# Patient Record
Sex: Male | Born: 1980 | Race: Black or African American | Hispanic: No | Marital: Single | Smoking: Never smoker
Health system: Southern US, Academic
[De-identification: ages and names within clinical notes are randomized; demographics above are authoritative.]

## PROBLEM LIST (undated history)

## (undated) DIAGNOSIS — L89159 Pressure ulcer of sacral region, unspecified stage: Secondary | ICD-10-CM

## (undated) DIAGNOSIS — B1920 Unspecified viral hepatitis C without hepatic coma: Secondary | ICD-10-CM

## (undated) DIAGNOSIS — G8220 Paraplegia, unspecified: Secondary | ICD-10-CM

## (undated) DIAGNOSIS — F1990 Other psychoactive substance use, unspecified, uncomplicated: Secondary | ICD-10-CM

## (undated) HISTORY — PX: HX COLOSTOMY: SHX63

## (undated) HISTORY — PX: HIP DEBRIDEMENT: SHX1752

---

## 2015-02-02 DIAGNOSIS — R079 Chest pain, unspecified: Secondary | ICD-10-CM

## 2015-02-02 DIAGNOSIS — R0600 Dyspnea, unspecified: Secondary | ICD-10-CM

## 2015-02-02 DIAGNOSIS — R0602 Shortness of breath: Secondary | ICD-10-CM

## 2015-04-25 DIAGNOSIS — L89229 Pressure ulcer of left hip, unspecified stage: Secondary | ICD-10-CM

## 2015-04-25 DIAGNOSIS — L89219 Pressure ulcer of right hip, unspecified stage: Secondary | ICD-10-CM

## 2015-08-15 ENCOUNTER — Ambulatory Visit (HOSPITAL_COMMUNITY): Payer: Self-pay | Admitting: GENERAL SURGERY

## 2016-08-23 DIAGNOSIS — L89154 Pressure ulcer of sacral region, stage 4: Secondary | ICD-10-CM

## 2016-08-26 DIAGNOSIS — F17210 Nicotine dependence, cigarettes, uncomplicated: Secondary | ICD-10-CM

## 2016-08-26 DIAGNOSIS — F329 Major depressive disorder, single episode, unspecified: Secondary | ICD-10-CM

## 2016-11-29 ENCOUNTER — Inpatient Hospital Stay (HOSPITAL_COMMUNITY): Payer: Medicare Other | Admitting: Radiology

## 2016-11-29 ENCOUNTER — Inpatient Hospital Stay (HOSPITAL_COMMUNITY): Payer: Medicare Other | Admitting: Internal Medicine

## 2016-11-29 ENCOUNTER — Encounter (HOSPITAL_COMMUNITY): Payer: Self-pay | Admitting: Internal Medicine

## 2016-11-29 ENCOUNTER — Inpatient Hospital Stay
Admission: EM | Admit: 2016-11-29 | Discharge: 2016-12-08 | DRG: 871 | Disposition: A | Discharge status: Other Acute Inpatient Hospital | Payer: Medicare Other | Source: Other Acute Inpatient Hospital | Attending: Internal Medicine | Admitting: Internal Medicine

## 2016-11-29 DIAGNOSIS — R0602 Shortness of breath: Secondary | ICD-10-CM | POA: Diagnosis not present

## 2016-11-29 DIAGNOSIS — E43 Unspecified severe protein-calorie malnutrition: Secondary | ICD-10-CM | POA: Diagnosis present

## 2016-11-29 DIAGNOSIS — R253 Fasciculation: Secondary | ICD-10-CM | POA: Diagnosis not present

## 2016-11-29 DIAGNOSIS — R112 Nausea with vomiting, unspecified: Secondary | ICD-10-CM | POA: Diagnosis not present

## 2016-11-29 DIAGNOSIS — G8220 Paraplegia, unspecified: Secondary | ICD-10-CM | POA: Diagnosis present

## 2016-11-29 DIAGNOSIS — M549 Dorsalgia, unspecified: Secondary | ICD-10-CM | POA: Diagnosis not present

## 2016-11-29 DIAGNOSIS — B1920 Unspecified viral hepatitis C without hepatic coma: Secondary | ICD-10-CM | POA: Diagnosis present

## 2016-11-29 DIAGNOSIS — F39 Unspecified mood [affective] disorder: Secondary | ICD-10-CM | POA: Diagnosis present

## 2016-11-29 DIAGNOSIS — Z79899 Other long term (current) drug therapy: Secondary | ICD-10-CM

## 2016-11-29 DIAGNOSIS — F329 Major depressive disorder, single episode, unspecified: Secondary | ICD-10-CM | POA: Diagnosis present

## 2016-11-29 DIAGNOSIS — L89154 Pressure ulcer of sacral region, stage 4: Secondary | ICD-10-CM | POA: Diagnosis present

## 2016-11-29 DIAGNOSIS — Z915 Personal history of self-harm: Secondary | ICD-10-CM

## 2016-11-29 DIAGNOSIS — W3400XS Accidental discharge from unspecified firearms or gun, sequela: Secondary | ICD-10-CM

## 2016-11-29 DIAGNOSIS — Z932 Ileostomy status: Secondary | ICD-10-CM

## 2016-11-29 DIAGNOSIS — R600 Localized edema: Secondary | ICD-10-CM | POA: Diagnosis not present

## 2016-11-29 DIAGNOSIS — Z6827 Body mass index (BMI) 27.0-27.9, adult: Secondary | ICD-10-CM

## 2016-11-29 DIAGNOSIS — L8990 Pressure ulcer of unspecified site, unspecified stage: Secondary | ICD-10-CM | POA: Diagnosis present

## 2016-11-29 DIAGNOSIS — A4102 Sepsis due to Methicillin resistant Staphylococcus aureus: Principal | ICD-10-CM | POA: Diagnosis present

## 2016-11-29 DIAGNOSIS — D62 Acute posthemorrhagic anemia: Secondary | ICD-10-CM | POA: Diagnosis present

## 2016-11-29 DIAGNOSIS — R7881 Bacteremia: Secondary | ICD-10-CM

## 2016-11-29 DIAGNOSIS — Z9081 Acquired absence of spleen: Secondary | ICD-10-CM

## 2016-11-29 DIAGNOSIS — G510 Bell's palsy: Secondary | ICD-10-CM | POA: Diagnosis present

## 2016-11-29 DIAGNOSIS — R768 Other specified abnormal immunological findings in serum: Secondary | ICD-10-CM | POA: Diagnosis present

## 2016-11-29 DIAGNOSIS — I38 Endocarditis, valve unspecified: Secondary | ICD-10-CM

## 2016-11-29 DIAGNOSIS — T148XXS Other injury of unspecified body region, sequela: Secondary | ICD-10-CM

## 2016-11-29 DIAGNOSIS — Z87891 Personal history of nicotine dependence: Secondary | ICD-10-CM

## 2016-11-29 DIAGNOSIS — S73005A Unspecified dislocation of left hip, initial encounter: Secondary | ICD-10-CM | POA: Diagnosis present

## 2016-11-29 DIAGNOSIS — Z9119 Patient's noncompliance with other medical treatment and regimen: Secondary | ICD-10-CM

## 2016-11-29 DIAGNOSIS — M8660 Other chronic osteomyelitis, unspecified site: Secondary | ICD-10-CM | POA: Diagnosis present

## 2016-11-29 DIAGNOSIS — R000 Tachycardia, unspecified: Secondary | ICD-10-CM | POA: Diagnosis present

## 2016-11-29 DIAGNOSIS — Z765 Malingerer [conscious simulation]: Secondary | ICD-10-CM | POA: Diagnosis present

## 2016-11-29 DIAGNOSIS — B9562 Methicillin resistant Staphylococcus aureus infection as the cause of diseases classified elsewhere: Secondary | ICD-10-CM

## 2016-11-29 DIAGNOSIS — A419 Sepsis, unspecified organism: Secondary | ICD-10-CM | POA: Diagnosis present

## 2016-11-29 DIAGNOSIS — Z452 Encounter for adjustment and management of vascular access device: Secondary | ICD-10-CM

## 2016-11-29 HISTORY — DX: Other psychoactive substance use, unspecified, uncomplicated: F19.90

## 2016-11-29 HISTORY — DX: Paraplegia, unspecified (CMS HCC): G82.20

## 2016-11-29 HISTORY — DX: Bacteremia: R78.81

## 2016-11-29 HISTORY — DX: Unspecified viral hepatitis C without hepatic coma: B19.20

## 2016-11-29 HISTORY — DX: Pressure ulcer of sacral region, unspecified stage: L89.159

## 2016-11-29 HISTORY — DX: Methicillin resistant Staphylococcus aureus infection as the cause of diseases classified elsewhere: B95.62

## 2016-11-29 MED ORDER — MORPHINE 4 MG/ML INTRAVENOUS CARTRIDGE
4.0000 mg | CARTRIDGE | INTRAVENOUS | Status: DC | PRN
Start: 2016-11-29 — End: 2016-11-29

## 2016-11-29 MED ORDER — SODIUM CHLORIDE 0.9 % (FLUSH) INJECTION SYRINGE
2.0000 mL | INJECTION | INTRAMUSCULAR | Status: DC | PRN
Start: 2016-11-29 — End: 2016-12-08
  Administered 2016-11-30: 6 mL
  Administered 2016-11-30: 4 mL
  Filled 2016-11-29 (×2): qty 10

## 2016-11-29 MED ORDER — MORPHINE 4 MG/ML INTRAVENOUS CARTRIDGE
4.00 mg | CARTRIDGE | Freq: Four times a day (QID) | INTRAVENOUS | Status: DC | PRN
Start: 2016-11-29 — End: 2016-12-02
  Administered 2016-11-30: 4 mg via INTRAVENOUS
  Filled 2016-11-29: qty 1

## 2016-11-29 MED ORDER — SODIUM CHLORIDE 0.9 % INTRAVENOUS SOLUTION
INTRAVENOUS | Status: DC
Start: 2016-11-30 — End: 2016-12-03
  Administered 2016-11-30 (×2): 75 mL/h via INTRAVENOUS

## 2016-11-29 MED ORDER — PANTOPRAZOLE 40 MG TABLET,DELAYED RELEASE
40.0000 mg | DELAYED_RELEASE_TABLET | Freq: Every day | ORAL | Status: DC
Start: 2016-11-30 — End: 2016-12-08
  Administered 2016-11-30 – 2016-12-08 (×9): 40 mg via ORAL
  Filled 2016-11-29 (×10): qty 1

## 2016-11-29 MED ORDER — FUROSEMIDE 20 MG TABLET
10.00 mg | ORAL_TABLET | Freq: Every evening | ORAL | Status: DC
Start: 2016-11-30 — End: 2016-12-08
  Administered 2016-11-30: 10 mg via ORAL
  Administered 2016-11-30: 0 mg via ORAL
  Administered 2016-12-01 – 2016-12-07 (×7): 10 mg via ORAL
  Filled 2016-11-29 (×2): qty 1
  Filled 2016-11-29: qty 0.5
  Filled 2016-11-29: qty 1
  Filled 2016-11-29: qty 0.5
  Filled 2016-11-29: qty 1
  Filled 2016-11-29 (×3): qty 0.5
  Filled 2016-11-29: qty 1
  Filled 2016-11-29: qty 0.5

## 2016-11-29 MED ORDER — FUROSEMIDE 20 MG TABLET
20.00 mg | ORAL_TABLET | Freq: Every morning | ORAL | Status: DC
Start: 2016-11-30 — End: 2016-12-08
  Administered 2016-11-30 – 2016-12-02 (×3): 20 mg via ORAL
  Administered 2016-12-03: 0 mg via ORAL
  Administered 2016-12-04 – 2016-12-08 (×5): 20 mg via ORAL
  Filled 2016-11-29 (×11): qty 1

## 2016-11-29 MED ORDER — ACETAMINOPHEN 325 MG TABLET
650.0000 mg | ORAL_TABLET | ORAL | Status: DC | PRN
Start: 2016-11-29 — End: 2016-11-30
  Administered 2016-11-30 (×3): 650 mg via ORAL
  Filled 2016-11-29 (×3): qty 2

## 2016-11-29 MED ORDER — ENOXAPARIN 40 MG/0.4 ML SUBCUTANEOUS SYRINGE
40.0000 mg | INJECTION | SUBCUTANEOUS | Status: DC
Start: 2016-11-30 — End: 2016-12-02
  Administered 2016-11-30 – 2016-12-02 (×3): 40 mg via SUBCUTANEOUS
  Filled 2016-11-29 (×2): qty 0
  Filled 2016-11-29 (×2): qty 0.4
  Filled 2016-11-29: qty 0

## 2016-11-29 MED ORDER — OXYCODONE 5 MG TABLET
10.00 mg | ORAL_TABLET | ORAL | Status: DC | PRN
Start: 2016-11-29 — End: 2016-11-30
  Administered 2016-11-30: 10 mg via ORAL
  Administered 2016-11-30: 0 mg via ORAL
  Administered 2016-11-30 (×2): 10 mg via ORAL
  Filled 2016-11-29 (×4): qty 2

## 2016-11-29 MED ORDER — BACLOFEN 10 MG TABLET
20.00 mg | ORAL_TABLET | Freq: Three times a day (TID) | ORAL | Status: DC
Start: 2016-11-30 — End: 2016-12-08
  Administered 2016-11-30 (×3): 20 mg via ORAL
  Administered 2016-11-30: 0 mg via ORAL
  Administered 2016-12-01 – 2016-12-08 (×22): 20 mg via ORAL
  Filled 2016-11-29 (×30): qty 2

## 2016-11-29 MED ORDER — ESCITALOPRAM 20 MG TABLET
20.0000 mg | ORAL_TABLET | Freq: Every day | ORAL | Status: DC
Start: 2016-11-30 — End: 2016-12-08
  Administered 2016-11-30 – 2016-12-08 (×9): 20 mg via ORAL
  Filled 2016-11-29 (×9): qty 1

## 2016-11-29 MED ORDER — SODIUM CHLORIDE 0.9 % (FLUSH) INJECTION SYRINGE
2.0000 mL | INJECTION | Freq: Three times a day (TID) | INTRAMUSCULAR | Status: DC
Start: 2016-11-30 — End: 2016-12-08
  Administered 2016-11-30: 2 mL
  Administered 2016-11-30 – 2016-12-01 (×4): 0 mL
  Administered 2016-12-01 – 2016-12-03 (×6): 2 mL
  Administered 2016-12-04: 0 mL
  Administered 2016-12-04 (×2): 2 mL
  Administered 2016-12-04: 0 mL
  Administered 2016-12-05 – 2016-12-06 (×5): 2 mL
  Administered 2016-12-06: 0 mL
  Administered 2016-12-07 – 2016-12-08 (×4): 2 mL

## 2016-11-29 MED ORDER — DOCUSATE SODIUM 100 MG CAPSULE
100.00 mg | ORAL_CAPSULE | Freq: Two times a day (BID) | ORAL | Status: DC
Start: 2016-11-30 — End: 2016-11-30
  Administered 2016-11-30: 100 mg via ORAL
  Filled 2016-11-29 (×2): qty 1

## 2016-11-29 MED ORDER — PREGABALIN 25 MG CAPSULE
150.00 mg | ORAL_CAPSULE | Freq: Two times a day (BID) | ORAL | Status: DC
Start: 2016-11-30 — End: 2016-12-08
  Administered 2016-11-30 – 2016-12-08 (×18): 150 mg via ORAL
  Filled 2016-11-29: qty 1
  Filled 2016-11-29: qty 2
  Filled 2016-11-29 (×3): qty 1
  Filled 2016-11-29: qty 2
  Filled 2016-11-29 (×5): qty 1
  Filled 2016-11-29 (×5): qty 2
  Filled 2016-11-29: qty 1
  Filled 2016-11-29: qty 2
  Filled 2016-11-29 (×8): qty 1
  Filled 2016-11-29: qty 2
  Filled 2016-11-29 (×3): qty 1
  Filled 2016-11-29 (×5): qty 2
  Filled 2016-11-29 (×2): qty 1
  Filled 2016-11-29: qty 2
  Filled 2016-11-29: qty 1
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
## 2016-11-30 ENCOUNTER — Inpatient Hospital Stay (HOSPITAL_COMMUNITY): Payer: Medicare Other

## 2016-11-30 DIAGNOSIS — B1920 Unspecified viral hepatitis C without hepatic coma: Secondary | ICD-10-CM

## 2016-11-30 DIAGNOSIS — D72829 Elevated white blood cell count, unspecified: Secondary | ICD-10-CM

## 2016-11-30 DIAGNOSIS — A419 Sepsis, unspecified organism: Secondary | ICD-10-CM

## 2016-11-30 DIAGNOSIS — E43 Unspecified severe protein-calorie malnutrition: Secondary | ICD-10-CM

## 2016-11-30 DIAGNOSIS — G8220 Paraplegia, unspecified: Secondary | ICD-10-CM

## 2016-11-30 DIAGNOSIS — L89214 Pressure ulcer of right hip, stage 4: Secondary | ICD-10-CM

## 2016-11-30 DIAGNOSIS — R6510 Systemic inflammatory response syndrome (SIRS) of non-infectious origin without acute organ dysfunction: Secondary | ICD-10-CM

## 2016-11-30 DIAGNOSIS — L89224 Pressure ulcer of left hip, stage 4: Secondary | ICD-10-CM

## 2016-11-30 DIAGNOSIS — M868X8 Other osteomyelitis, other site: Secondary | ICD-10-CM

## 2016-11-30 LAB — CBC WITH DIFF
BASOPHIL #: 0.07 x10ˆ3/uL (ref 0.00–0.20)
BASOPHIL %: 0 %
EOSINOPHIL #: 0.18 x10ˆ3/uL (ref 0.00–0.50)
EOSINOPHIL %: 1 %
HCT: 26.2 % — ABNORMAL LOW (ref 36.7–47.0)
HGB: 8 g/dL — ABNORMAL LOW (ref 12.5–16.3)
LYMPHOCYTE #: 2.13 x10ˆ3/uL (ref 1.00–4.80)
LYMPHOCYTE %: 14 %
MCH: 25.4 pg — ABNORMAL LOW (ref 27.4–33.0)
MCHC: 30.7 g/dL — ABNORMAL LOW (ref 32.5–35.8)
MCV: 82.8 fL (ref 78.0–100.0)
MONOCYTE #: 1.35 x10ˆ3/uL — ABNORMAL HIGH (ref 0.30–1.00)
MONOCYTE %: 9 %
MPV: 6.2 fL — ABNORMAL LOW (ref 7.5–11.5)
NEUTROPHIL #: 11.35 x10ˆ3/uL — ABNORMAL HIGH (ref 1.50–7.70)
NEUTROPHIL %: 75 %
PLATELETS: 424 x10ˆ3/uL (ref 140–450)
RBC: 3.16 x10ˆ6/uL — ABNORMAL LOW (ref 4.06–5.63)
RDW: 19.8 % — ABNORMAL HIGH (ref 12.0–15.0)
WBC: 15.1 x10ˆ3/uL — ABNORMAL HIGH (ref 3.5–11.0)

## 2016-11-30 LAB — BASIC METABOLIC PANEL
ANION GAP: 7 mmol/L (ref 4–13)
BUN/CREA RATIO: 18 (ref 6–22)
BUN: 16 mg/dL (ref 8–25)
CALCIUM: 8.5 mg/dL (ref 8.5–10.2)
CHLORIDE: 100 mmol/L (ref 96–111)
CO2 TOTAL: 26 mmol/L (ref 22–32)
CREATININE: 0.88 mg/dL (ref 0.62–1.27)
ESTIMATED GFR: >59 mL/min/1.73mˆ2 (ref >59)
GLUCOSE: 94 mg/dL (ref 65–139)
POTASSIUM: 3.8 mmol/L (ref 3.5–5.1)
SODIUM: 133 mmol/L — ABNORMAL LOW (ref 136–145)

## 2016-11-30 LAB — VANCOMYCIN, RANDOM: VANCOMYCIN RANDOM: 9.2 ug/mL

## 2016-11-30 LAB — PT/INR
INR: 1.28 — ABNORMAL HIGH (ref 0.80–1.20)
PROTHROMBIN TIME: 14.8 s — ABNORMAL HIGH (ref 9.3–13.9)

## 2016-11-30 LAB — HEPATIC FUNCTION PANEL
ALBUMIN: 1.6 g/dL — ABNORMAL LOW (ref 3.5–5.0)
ALKALINE PHOSPHATASE: 285 U/L — ABNORMAL HIGH (ref <150)
ALT (SGPT): 19 U/L (ref <55)
AST (SGOT): 27 U/L (ref 8–48)
BILIRUBIN DIRECT: 0.2 mg/dL (ref <0.3)
BILIRUBIN TOTAL: 0.7 mg/dL (ref 0.3–1.3)
PROTEIN TOTAL: 7.6 g/dL (ref 6.4–8.3)

## 2016-11-30 LAB — URINALYSIS, MICROSCOPIC
HYALINE CASTS: 3 /LPF (ref <4.0)
RBCS: 5 /HPF (ref <6.0)
WBCS: 14 /HPF — ABNORMAL HIGH (ref <4.0)

## 2016-11-30 LAB — CREATINE KINASE (CK), TOTAL, SERUM OR PLASMA: CREATINE KINASE: 57 U/L (ref 45–225)

## 2016-11-30 LAB — PROCALCITONIN: PROCALCITONIN: 2.66 ng/mL — ABNORMAL HIGH (ref <0.50)

## 2016-11-30 LAB — PHOSPHORUS: PHOSPHORUS: 3.2 mg/dL (ref 2.4–4.7)

## 2016-11-30 LAB — MAGNESIUM: MAGNESIUM: 1.9 mg/dL (ref 1.6–2.5)

## 2016-11-30 LAB — LACTIC ACID LEVEL: LACTIC ACID: 1.5 mmol/L (ref 0.5–2.2)

## 2016-11-30 LAB — HEPATITIS C ANTIBODY SCREEN WITH REFLEX TO HCV PCR: HCV ANTIBODY QUALITATIVE: REACTIVE — AB

## 2016-11-30 LAB — HIV1/HIV2 SCREEN, COMBINED ANTIGEN AND ANTIBODY: HIV SCREEN, COMBINED ANTIGEN & ANTIBODY: NEGATIVE

## 2016-11-30 LAB — C-REACTIVE PROTEIN(CRP),INFLAMMATION: CRP INFLAMMATION: 290 mg/L — ABNORMAL HIGH (ref <=8.0)

## 2016-11-30 MED ORDER — OXYCODONE 5 MG TABLET
15.0000 mg | ORAL_TABLET | ORAL | Status: DC | PRN
Start: 2016-11-30 — End: 2016-12-08

## 2016-11-30 MED ORDER — NUTRITION PROTEIN SUPPLEMENT - TUBE FEED
1.0000 | Freq: Three times a day (TID) | Status: DC
Start: 2016-11-30 — End: 2016-12-01
  Administered 2016-11-30: 0 g via ORAL
  Administered 2016-11-30 – 2016-12-01 (×3): 15 g via ORAL

## 2016-11-30 MED ORDER — ZINC SULFATE 50 MG ZINC (220 MG) TABLET
220.0000 mg | ORAL_TABLET | Freq: Every day | ORAL | Status: DC
Start: 2016-11-30 — End: 2016-12-08
  Administered 2016-11-30 – 2016-12-08 (×9): 220 mg via ORAL
  Filled 2016-11-30 (×9): qty 1

## 2016-11-30 MED ORDER — ACETAMINOPHEN 325 MG TABLET
650.0000 mg | ORAL_TABLET | Freq: Four times a day (QID) | ORAL | Status: DC
Start: 2016-11-30 — End: 2016-12-08
  Administered 2016-11-30: 0 mg via ORAL
  Administered 2016-11-30 – 2016-12-01 (×3): 650 mg via ORAL
  Administered 2016-12-01: 0 mg via ORAL
  Administered 2016-12-01 – 2016-12-02 (×3): 650 mg via ORAL
  Administered 2016-12-02: 0 mg via ORAL
  Administered 2016-12-02: 650 mg via ORAL
  Administered 2016-12-03: 0 mg via ORAL
  Administered 2016-12-03: 650 mg via ORAL
  Administered 2016-12-03 (×2): 0 mg via ORAL
  Administered 2016-12-04: 650 mg via ORAL
  Administered 2016-12-04: 0 mg via ORAL
  Administered 2016-12-04 – 2016-12-06 (×7): 650 mg via ORAL
  Administered 2016-12-06 (×3): 0 mg via ORAL
  Administered 2016-12-07 (×4): 650 mg via ORAL
  Administered 2016-12-08: 0 mg via ORAL
  Administered 2016-12-08: 650 mg via ORAL
  Filled 2016-11-30 (×24): qty 2

## 2016-11-30 MED ORDER — OXYCODONE 5 MG TABLET
20.0000 mg | ORAL_TABLET | ORAL | Status: DC | PRN
Start: 2016-11-30 — End: 2016-12-08
  Administered 2016-11-30 – 2016-12-08 (×22): 20 mg via ORAL
  Filled 2016-11-30 (×22): qty 4

## 2016-11-30 MED ORDER — ASCORBIC ACID (VITAMIN C) 500 MG TABLET
500.0000 mg | ORAL_TABLET | Freq: Two times a day (BID) | ORAL | Status: DC
Start: 2016-11-30 — End: 2016-12-08
  Administered 2016-11-30 – 2016-12-08 (×17): 500 mg via ORAL
  Filled 2016-11-30 (×18): qty 1

## 2016-11-30 MED ORDER — PERFLUTREN LIPID MICROSPHERES 1.1 MG/ML INTRAVENOUS SUSPENSION
0.30 mL | INTRAVENOUS | Status: DC
Start: 2016-12-01 — End: 2016-12-08

## 2016-11-30 MED ORDER — DAPTOMYCIN 500 MG INTRAVENOUS SOLUTION
6.00 mg/kg | INTRAVENOUS | Status: DC
Start: 2016-11-30 — End: 2016-11-30

## 2016-11-30 MED ORDER — FLU VACCINE QS 2018-19(6 MOS UP)(PF)60 MCG(15 MCGX4)/0.5 ML IM SYRINGE
0.50 mL | INJECTION | Freq: Once | INTRAMUSCULAR | Status: DC
Start: 2016-11-30 — End: 2016-12-08
  Administered 2016-11-30: 0 mL via INTRAMUSCULAR
  Filled 2016-11-30: qty 1

## 2016-11-30 MED ORDER — CEFEPIME 2 GRAM/100 ML IN DEXTROSE PREMIX IVPB - EXTENDED INFUSION
2.00 g | INJECTION | Freq: Once | INTRAVENOUS | Status: AC
Start: 2016-11-30 — End: 2016-11-30
  Administered 2016-11-30: 2 g via INTRAVENOUS
  Administered 2016-11-30: 0 g via INTRAVENOUS
  Filled 2016-11-30: qty 100

## 2016-11-30 MED ORDER — CEFEPIME 2 GRAM/100 ML IN DEXTROSE PREMIX IVPB - EXTENDED INFUSION
2.00 g | INJECTION | Freq: Three times a day (TID) | INTRAVENOUS | Status: DC
Start: 2016-11-30 — End: 2016-12-08
  Administered 2016-11-30: 0 g via INTRAVENOUS
  Administered 2016-11-30: 2 g via INTRAVENOUS
  Administered 2016-12-01 (×3): 0 g via INTRAVENOUS
  Administered 2016-12-01 (×2): 2 g via INTRAVENOUS
  Administered 2016-12-02: 0 g via INTRAVENOUS
  Administered 2016-12-02: 2 g via INTRAVENOUS
  Administered 2016-12-02: 0 g via INTRAVENOUS
  Administered 2016-12-02: 2 g via INTRAVENOUS
  Administered 2016-12-02: 0 g via INTRAVENOUS
  Administered 2016-12-02: 2 g via INTRAVENOUS
  Administered 2016-12-03 (×3): 0 g via INTRAVENOUS
  Administered 2016-12-03 (×3): 2 g via INTRAVENOUS
  Administered 2016-12-04 (×2): 0 g via INTRAVENOUS
  Administered 2016-12-04 (×2): 2 g via INTRAVENOUS
  Administered 2016-12-04: 0 g via INTRAVENOUS
  Administered 2016-12-04: 2 g via INTRAVENOUS
  Administered 2016-12-05: 0 g via INTRAVENOUS
  Administered 2016-12-05: 2 g via INTRAVENOUS
  Administered 2016-12-05: 0 g via INTRAVENOUS
  Administered 2016-12-05 (×2): 2 g via INTRAVENOUS
  Administered 2016-12-05: 0 g via INTRAVENOUS
  Administered 2016-12-06: 2 g via INTRAVENOUS
  Administered 2016-12-06 (×3): 0 g via INTRAVENOUS
  Administered 2016-12-06: 2 g via INTRAVENOUS
  Administered 2016-12-07: 0 g via INTRAVENOUS
  Administered 2016-12-07 (×2): 2 g via INTRAVENOUS
  Administered 2016-12-07 (×2): 0 g via INTRAVENOUS
  Administered 2016-12-07 – 2016-12-08 (×3): 2 g via INTRAVENOUS
  Administered 2016-12-08: 0 g via INTRAVENOUS
  Filled 2016-11-30 (×28): qty 100

## 2016-11-30 MED ORDER — DAPTOMYCIN 500 MG INTRAVENOUS SOLUTION
8.00 mg/kg | INTRAVENOUS | Status: DC
Start: 2016-11-30 — End: 2016-12-03
  Administered 2016-11-30: 0 mg via INTRAVENOUS
  Administered 2016-11-30 – 2016-12-01 (×2): 700 mg via INTRAVENOUS
  Administered 2016-12-01: 0 mg via INTRAVENOUS
  Administered 2016-12-02: 700 mg via INTRAVENOUS
  Administered 2016-12-02 – 2016-12-03 (×2): 0 mg via INTRAVENOUS
  Administered 2016-12-03: 700 mg via INTRAVENOUS
  Filled 2016-11-30 (×4): qty 14

## 2016-11-30 MED ORDER — KETOROLAC 30 MG/ML (1 ML) INJECTION SOLUTION
30.0000 mg | Freq: Three times a day (TID) | INTRAMUSCULAR | Status: AC
Start: 2016-11-30 — End: 2016-12-03
  Administered 2016-11-30 – 2016-12-03 (×9): 30 mg via INTRAVENOUS
  Filled 2016-11-30 (×9): qty 1

## 2016-11-30 MED ORDER — SENNOSIDES 8.6 MG-DOCUSATE SODIUM 50 MG TABLET
1.0000 | ORAL_TABLET | Freq: Two times a day (BID) | ORAL | Status: DC
Start: 2016-11-30 — End: 2016-12-08
  Administered 2016-11-30: 1 via ORAL
  Administered 2016-11-30 – 2016-12-01 (×2): 0 via ORAL
  Administered 2016-12-01: 1 via ORAL
  Administered 2016-12-02: 0 via ORAL
  Administered 2016-12-02 – 2016-12-03 (×2): 1 via ORAL
  Administered 2016-12-03: 0 via ORAL
  Administered 2016-12-04: 1 via ORAL
  Administered 2016-12-04: 0 via ORAL
  Administered 2016-12-05: 1 via ORAL
  Administered 2016-12-05: 0 via ORAL
  Administered 2016-12-06: 1 via ORAL
  Administered 2016-12-06: 0 via ORAL
  Administered 2016-12-07 – 2016-12-08 (×3): 1 via ORAL
  Filled 2016-11-30 (×11): qty 1

## 2016-11-30 MED ADMIN — amino ac-protein hydro-whey protein 10 gram-100 kcal/30 mL oral liquid: ORAL | @ 22:00:00

## 2016-11-30 MED ADMIN — sennosides 8.6 mg-docusate sodium 50 mg tablet: ORAL | @ 21:00:00

## 2016-11-30 MED ADMIN — sodium chloride 0.9 % intravenous solution: INTRAVENOUS | @ 02:00:00 | NDC 00338004904

## 2016-11-30 MED ADMIN — lactated Ringers intravenous solution: ORAL | NDC 00338011704

## 2016-11-30 MED ADMIN — fentaNYL (PF) 2 mcg/mL-bupivacaine 0.125 %-NaCl injection solution: ORAL | @ 15:00:00

## 2016-11-30 MED ADMIN — oseltamivir 6 mg/mL oral suspension: INTRAVENOUS | @ 04:00:00

## 2016-11-30 MED ADMIN — sodium chloride 0.9 % (flush) injection syringe: ORAL | @ 01:00:00

## 2016-11-30 MED ADMIN — nystatin 100,000 unit/gram topical powder: ORAL | @ 21:00:00

## 2016-11-30 MED ADMIN — permethrin 1 % topical liquid: @ 02:00:00 | NDC 6373612002

## 2016-11-30 NOTE — Progress Notes (Signed)
[facility]  Plastic Surgery Progress Note    [name],[name], 36 y.o. male  Date of Birth:  1980/07/17  Date of Admission:  11/29/2016  Date of service: 11/30/2016  Length of stay 1    Chief Complaint: chronic pressure ulcers, paraplegia    SUBJECTIVE:  36 y.o. male resting in bed. Febrile and tachycardic overnight. Reports living alone and caring for himself. Denies any pain.     OBJECTIVE:    Vitals Filed       11/29/2016 2231 11/30/2016 0218 11/30/2016 0622 11/30/2016 1100    BP: 124/72 110/65 (!)  106/51 -    Pulse: (!)  118 (!)  124 (!)  119 -    Temp: (!)  39.3 C (102.7 F) 38.2 C (100.8 F) 36.5 C (97.7 F) -    Resp: 20 20 18  -          Physical exam:    Constitutional:  no distress  Respiratory:  Normal respiratory effort  Cardiovascular:  RRR  Gastrointestinal:  Soft, ND.  No guarding or rebound.   Integumentary:  Skin warm and dry, tatoos  Neurologic:  paraplegic, Alert and oriented x3      Ext:       Left Lower Extremity:  - Deformity / Skin: pressures ulcers on the thigh x2, healing ulcers of the foot/ankle   - Open Wounds: 2x thigh   - Pain: non TTP   - Sensory: no  - Motor: no  - Vascular: swelling, brisk cap refill, palpable pulses    Left thigh  Grade 4 pressure ulcers  16x12 cm, depth 6 cm  6x8 cm, depth 5 cm              Right Lower Extremity:  - Deformity / Skin: pressure ulcer   - Open Wounds: 1x thigh   - Pain:  TTP   - Sensory: no  - Motor: no, femur disarticulated, free movement   - Vascular: moderate swelling, brisk cap refill, palpable pulses    R thigh  16x18 cm, depth 6 cm          Current Medications:    Current Facility-Administered Medications:  acetaminophen (TYLENOL) tablet 650 mg Oral Q4H PRN   ascorbic acid (VITAMIN C) tablet 500 mg Oral 2x/day   baclofen (LIORESAL) tablet 20 mg Oral 3x/day   ceFEPime (MAXIPIME) 2 g in iso-osmotic 100 mL premix IVPB 2 g Intravenous Q8H   DAPTOmycin (CUBICIN) 700 mg in NS 100 mL IVPB 8 mg/kg Intravenous Q24H   docusate sodium  (COLACE) capsule 100 mg Oral 2x/day   enoxaparin PF (LOVENOX) 40 mg/0.4 mL SubQ injection 40 mg Subcutaneous Q24H   escitalopram (LEXAPRO) tablet 20 mg Oral Daily   furosemide (LASIX) tablet 20 mg Oral QAM   furosemide (LASIX) tablet 10 mg Oral QPM   influenza virus vaccine (PF) IM injection (FLUARIX for ages 6 months and older) 0.5 mL Intramuscular Once   morphine 4 mg/mL injection 4 mg Intravenous Q6H PRN   NS flush syringe 2 mL Intracatheter Q8HRS   And      NS flush syringe 2-6 mL Intracatheter Q1 MIN PRN   NS premix infusion  Intravenous Continuous   nutrition protein supplement 15 g per 30 mL packet 1 Packet Oral 3x/day   oxyCODONE (ROXICODONE) immediate release tablet 10 mg Oral Q4H PRN   pantoprazole (PROTONIX) delayed release tablet 40 mg Oral Daily   pregabalin (LYRICA) capsule 150 mg 150 mg Oral 2x/day  zinc sulfate (ZINCATE) 220 mg (50 mg elemental zinc) tablet 220 mg Oral Daily       I/O:  I/O last 24 hours:    Intake/Output Summary (Last 24 hours) at 11/30/16 1209  Last data filed at 11/30/16 1000   Gross per 24 hour   Intake              375 ml   Output             2100 ml   Net            -1725 ml     I/O current shift:  09/23 0800 - 09/23 1559  In: -   Out: 500 [Urine:500]    Nutrition/Residuals:  MNT PROTOCOL FOR DIETITIAN  DIETARY ORAL SUPPLEMENTS Oral Supplements with tray: Juven - Fruit Punch (limited to dietitian ordering only); LUNCH/DINNER; 1 Can  MNT PROTOCOL FOR DIETITIAN  DIET REGULAR Additional modifications/limitations: 60 G PROTEIN    Labs  Reviewed:   Lab Results Today:    Results for orders placed or performed during the hospital encounter of 11/29/16 (from the past 24 hour(s))   BASIC METABOLIC PANEL   Result Value Ref Range    SODIUM 133 (L) 136 - 145 mmol/L    POTASSIUM 3.8 3.5 - 5.1 mmol/L    CHLORIDE 100 96 - 111 mmol/L    CO2 TOTAL 26 22 - 32 mmol/L    ANION GAP 7 4 - 13 mmol/L    CALCIUM 8.5 8.5 - 10.2 mg/dL    GLUCOSE 94 65 - 139 mg/dL    BUN 16 8 - 25 mg/dL    CREATININE  0.88 0.62 - 1.27 mg/dL    BUN/CREA RATIO 18 6 - 22    ESTIMATED GFR >59 >59 mL/min/1.66m2   HEPATIC FUNCTION PANEL   Result Value Ref Range    ALBUMIN 1.6 (L) 3.5 - 5.0 g/dL    ALKALINE PHOSPHATASE 285 (H) <150 U/L    ALT (SGPT) 19 <55 U/L    AST (SGOT) 27 8 - 48 U/L    BILIRUBIN TOTAL 0.7 0.3 - 1.3 mg/dL    BILIRUBIN DIRECT 0.2 <0.3 mg/dL    PROTEIN TOTAL 7.6 6.4 - 8.3 g/dL   MAGNESIUM   Result Value Ref Range    MAGNESIUM 1.9 1.6 - 2.5 mg/dL   PHOSPHORUS   Result Value Ref Range    PHOSPHORUS 3.2 2.4 - 4.7 mg/dL   CREATINE KINASE (CK), TOTAL, SERUM   Result Value Ref Range    CREATINE KINASE 57 45 - 225 U/L   PT/INR   Result Value Ref Range    PROTHROMBIN TIME 14.8 (H) 9.3 - 13.9 seconds    INR 1.28 (H) 0.80 - 1.20   LACTIC ACID   Result Value Ref Range    LACTIC ACID 1.5 0.5 - 2.2 mmol/L   CBC WITH DIFF   Result Value Ref Range    WBC 15.1 (H) 3.5 - 11.0 x10^3/uL    RBC 3.16 (L) 4.06 - 5.63 x10^6/uL    HGB 8.0 (L) 12.5 - 16.3 g/dL    HCT 26.2 (L) 36.7 - 47.0 %    MCV 82.8 78.0 - 100.0 fL    MCH 25.4 (L) 27.4 - 33.0 pg    MCHC 30.7 (L) 32.5 - 35.8 g/dL    RDW 19.8 (H) 12.0 - 15.0 %    PLATELETS 424 140 - 450 x10^3/uL    MPV 6.2 (L) 7.5 - 11.5  fL    NEUTROPHIL % 75 %    LYMPHOCYTE % 14 %    MONOCYTE % 9 %    EOSINOPHIL % 1 %    BASOPHIL % 0 %    NEUTROPHIL # 11.35 (H) 1.50 - 7.70 x10^3/uL    LYMPHOCYTE # 2.13 1.00 - 4.80 x10^3/uL    MONOCYTE # 1.35 (H) 0.30 - 1.00 x10^3/uL    EOSINOPHIL # 0.18 0.00 - 0.50 x10^3/uL    BASOPHIL # 0.07 0.00 - 0.20 x10^3/uL   VANCOMYCIN, RANDOM   Result Value Ref Range    VANCOMYCIN RANDOM 9.2   ug/mL       Microbiology    BCx 9/22  pending      ASSESSMENT:    Active Hospital Problems    Diagnosis   . Decubitus ulcer       [name] is a 36 y.o. male w PMH significant for paraplegia for 63 y due to [place], 2 y h/o decubitus ulcers, IVDU, Hep C who was transferred from OSH for second opinion for wound closure.       PLAN:    - No acute surgical intervention, will discuss possible  bilateral amputation with local tissue re-arrangement  - Source of sepsis unlikely from decubitus ulcers  - Consider consulting Ortho  - Please consult wound care (Dr. Marcille Blanco team) for management and optimization  - Continue with offloading, please order specialty bed and bed trapeze for mobility  - Optimization of the nutritional status  - Albumin 1.6 g, HgA1c pending  - consult MNT, recommend protein supplementation  - Recommend consulting ID  - DVT prophylaxis: ambulation  - Antibiotics: Dapto, cefepime, continue per ID recs  - Micro: BCx pending  - Pathology: none  - Pain: po pain medication  - Dressing: Wet to dry  - Diet: Regular diet      Evidence Based Guidelines for Wound Coverage Procedures:    Evidence-Based principles for patient optimization for reconstructive surgery of acute and chronic wounds which results in likely success for surgical reconstruction and long term outcomes:    - Good patient communication and motivation  - Comorbidities - optimize/treat  - Nutritional optimization  - Smoking cessation  - Glucose control  - Infection control  - Wound bed treatment/debridement  - Vascular sufficiency    Criteria for Patient Suitability for Reconstruction Consideration:     *All Criteria Must be Met*    1.) Pt's ability to cooperate with immediate surgical treatment regimen  2.) Pt's likely potential to continue pressure off-loading and routine skin care management when outside acute hospital environment  3.) Diabetic Control - A1C < 7.0  4.) Adequate Protein Nutrition - Albumin > 3.0  5.) Nicotine - demonstrable absence of nicotine use by urine or blood testing  6.) Infection Control - wound culture with < 10 ^5 organisms/gm tissue  7.) Adequate recipient tissue vascular milieu - well perfused recipient site (Tcom > 30, ABI 0.9)      Adrian Saran, MD  11/30/2016, 12:09  Plastic Surgery PGY-2   Pager [phone]          Late entry for 11/30/16. I saw and examined the patient.  I reviewed the resident's  note.  I agree with the findings and plan of care as documented in the resident's note.  Any exceptions/additions are edited/noted.    I examined this patient with resident and we discussed with patient his risk factors and environment factors that need to be improved before  considering any type of closure, if possible.   Recommend patient to follow with wound care team as inpatient and outpatient.   No further recommendations per PLS.   Will sign off, call if any questions or concerns.         Blanchie Dessert, MD

## 2016-11-30 NOTE — Care Plan (Signed)
Problem: Infection, Risk/Actual (Adult)  Goal: Identify Related Risk Factors and Signs and Symptoms  Related risk factors and signs and symptoms are identified upon initiation of Human Response Clinical Practice Guideline (CPG).   Outcome: Completed Date Met: 11/30/16    Goal: Infection Prevention/Resolution  Patient will demonstrate the desired outcomes by discharge/transition of care.   Outcome: Ongoing (see interventions/notes)      Problem: Mobility, Physical Impaired (Adult)  Goal: Identify Related Risk Factors and Signs and Symptoms  Related risk factors and signs and symptoms are identified upon initiation of Human Response Clinical Practice Guideline (CPG).   Outcome: Completed Date Met: 11/30/16    Goal: Enhanced Mobility Skills  Patient will demonstrate the desired outcomes by discharge/transition of care.   Outcome: Ongoing (see interventions/notes)    Goal: Enhanced Functional Ability  Patient will demonstrate the desired outcomes by discharge/transition of care.   Outcome: Ongoing (see interventions/notes)      Problem: Health Knowledge, Opportunity to Enhance (Adult,Obstetrics,Pediatric)  Goal: Identify Related Risk Factors and Signs and Symptoms  Related risk factors and signs and symptoms are identified upon initiation of Human Response Clinical Practice Guideline (CPG).   Outcome: Completed Date Met: 11/30/16    Goal: Knowledgeable about Health Subject/Topic  Patient will demonstrate the desired outcomes by discharge/transition of care.   Outcome: Ongoing (see interventions/notes)      Problem: Self-Care Deficit (Adult,Obstetrics,Pediatric)  Goal: Identify Related Risk Factors and Signs and Symptoms  Related risk factors and signs and symptoms are identified upon initiation of Human Response Clinical Practice Guideline (CPG).   Outcome: Change in Care Plan/more inclusive CPG Date Met: 11/30/16    Goal: Improved Ability to Perform BADL and IADL  Patient will demonstrate the desired outcomes by  discharge/transition of care.   Outcome: Ongoing (see interventions/notes)      Problem: Surgery Nonspecified (Adult)  Prevent and manage potential problems including:  1. bleeding/anemia  2. bowel motility decreased  3. infection  4. pain  5. postoperative nausea and vomiting  6. postoperative urinary retention  7. respiratory compromise  8. situational response  9. VTE (venous thromboembolism)  10. wound healing impaired   Goal: Signs and Symptoms of Listed Potential Problems Will be Absent, Minimized or Managed (Surgery Nonspecified)  Signs and symptoms of listed potential problems will be absent, minimized or managed by discharge/transition of care (reference Surgery Nonspecified (Adult) CPG).   Outcome: Ongoing (see interventions/notes)    Goal: Anesthesia/Sedation Recovery  Outcome: Ongoing (see interventions/notes)      Problem: Wound (Includes Pressure Injury) (Adult)  Prevent and manage potential problems including:  1. bleeding  2. delayed wound healing  3. infection  4. pain  5. situational response  6. skin injury, new onset   Goal: Signs and Symptoms of Listed Potential Problems Will be Absent, Minimized or Managed (Wound)  Signs and symptoms of listed potential problems will be absent, minimized or managed by discharge/transition of care (reference Wound (Includes Pressure Injury) (Adult) CPG).   Outcome: Ongoing (see interventions/notes)      Comments: Patient admitted for wound care, antibiotic therapy. Paralyzed from waist down. Blood works done, imaging completed. LDAW noted.    Will be giving an update overnight.

## 2016-11-30 NOTE — Care Plan (Signed)
Problem: Infection, Risk/Actual (Adult)  Goal: Infection Prevention/Resolution  Patient will demonstrate the desired outcomes by discharge/transition of care.   Outcome: Ongoing (see interventions/notes)      Problem: Mobility, Physical Impaired (Adult)  Goal: Enhanced Mobility Skills  Patient will demonstrate the desired outcomes by discharge/transition of care.   Outcome: Ongoing (see interventions/notes)    Goal: Enhanced Functional Ability  Patient will demonstrate the desired outcomes by discharge/transition of care.   Outcome: Ongoing (see interventions/notes)      Problem: Health Knowledge, Opportunity to Enhance (Adult,Obstetrics,Pediatric)  Goal: Knowledgeable about Health Subject/Topic  Patient will demonstrate the desired outcomes by discharge/transition of care.   Outcome: Ongoing (see interventions/notes)      Problem: Self-Care Deficit (Adult,Obstetrics,Pediatric)  Goal: Improved Ability to Perform BADL and IADL  Patient will demonstrate the desired outcomes by discharge/transition of care.   Outcome: Ongoing (see interventions/notes)      Problem: Surgery Nonspecified (Adult)  Prevent and manage potential problems including:  1. bleeding/anemia  2. bowel motility decreased  3. infection  4. pain  5. postoperative nausea and vomiting  6. postoperative urinary retention  7. respiratory compromise  8. situational response  9. VTE (venous thromboembolism)  10. wound healing impaired   Goal: Signs and Symptoms of Listed Potential Problems Will be Absent, Minimized or Managed (Surgery Nonspecified)  Signs and symptoms of listed potential problems will be absent, minimized or managed by discharge/transition of care (reference Surgery Nonspecified (Adult) CPG).   Outcome: Ongoing (see interventions/notes)    Goal: Anesthesia/Sedation Recovery  Outcome: Ongoing (see interventions/notes)      Problem: Wound (Includes Pressure Injury) (Adult)  Prevent and manage potential problems including:  1. bleeding  2.  delayed wound healing  3. infection  4. pain  5. situational response  6. skin injury, new onset   Goal: Signs and Symptoms of Listed Potential Problems Will be Absent, Minimized or Managed (Wound)  Signs and symptoms of listed potential problems will be absent, minimized or managed by discharge/transition of care (reference Wound (Includes Pressure Injury) (Adult) CPG).   Outcome: Ongoing (see interventions/notes)      Problem: Depression (Adult,Obstetrics,Pediatric)  Goal: Establish/Maintain Self-Care Routine  Patient will demonstrate the desired outcomes by discharge/transition of care.   Outcome: Ongoing (see interventions/notes)    Goal: Improved/Stable Mood  Patient will demonstrate the desired outcomes by discharge/transition of care.   Outcome: Ongoing (see interventions/notes)      Problem: Skin Injury Risk (Adult,Obstetrics,Pediatric)  Goal: Skin Health and Integrity  Patient will demonstrate the desired outcomes by discharge/transition of care.   Outcome: Ongoing (see interventions/notes)      Problem: Behavioral Regulation Impairment (Disruptive Behavior) (Adult)  Goal: Improved Impulse/Aggression Control (Disruptive Behavior)  Outcome: Ongoing (see interventions/notes)      Problem: Social/Occupational/Functional Impairment (Disruptive Behavior) (Adult)  Goal: Improved Social/Occupational/Functional Skills (Disruptive Behavior)  Outcome: Ongoing (see interventions/notes)      Comments: Being kept for his antibiotic therapy, fluid resusc., pain control, and wound management over his complex PU sores.

## 2016-11-30 NOTE — Wound Therapy (Signed)
[facility]  Present On Admission Ostomy    Name: [name]  Date of Birth: Sep 23, 1980  MRN: [id number]  Date: 11/30/16  Time: 12:34    Past Medical History  No current outpatient prescriptions on file.     No Known Allergies  Past Medical History:   Diagnosis Date   . Hepatitis C    . IVDU (intravenous drug user)    . Paraplegia (CMS HCC)    . Sacral decubitus ulcer          Past Surgical History:   Procedure Laterality Date   . HIP DEBRIDEMENT     . HX COLOSTOMY           Family Medical History     None            Social History     Social History   . Marital status: Single     Spouse name: N/A   . Number of children: N/A   . Years of education: N/A     Social History Main Topics   . Smoking status: Never Smoker   . Smokeless tobacco: Never Used   . Alcohol use No   . Drug use: Yes   . Sexual activity: Not on file     Other Topics Concern   . Not on file     Social History Narrative   . No narrative on file       Filed Vitals:    11/29/16 2231 11/30/16 0218 11/30/16 0622   BP: 124/72 110/65 (!) 106/51   Pulse: (!) 118 (!) 124 (!) 119   Resp: Temp: (!) 39.3 C (102.7 F) 38.2 C (100.8 F) 36.5 C (97.7 F)   SpO2: 100% 97% 98%       Lab Results   Component Value Date    ALBUMIN 1.6 (L) 11/29/2016     No results found for: PREALBUMIN    Admission: 11/29/2016 10:31 PM  Reason for Current Admission: Decubitus ulcer [L89.90]    Year of Surgery: 2018    Stoma #1  Ostomy Pouch Currently used: Hollister 2 3/4 two piece  Ostomy Accessory Products Used: adhesive remover wipes  Stoma Type: Colostomy  Protrusion:Budded  Stoma Size: 1 3/4"  Location of Ostomy:left lower  Mucosa Color:NML  Peristomal Skin: Patient reports WNL  Pain: patient denies  Output: loose brown stool    Supplies are obtained by: Patient has not been home since surgery in July    Are you receiving adequate amount of supplies: N/A    Supplies currently used at home: N/A    Ostomy Summary/Comment: Patient resting in bed with no  visitors present upon assessment. Current pouch CDI with good seal noted. Patient denies need to change appliance at this time. Patient denies peristomal redness or irritation. Reviewed supplies patient currently uses at home. Patient reports not being home since surgery in July at [facility]. Discussed change frequency (every 3 days or 2-3 times a week). Provided patient with (2) 2 3/4 pouches, (2) 2 3/4 barriers, and adhesive remover wipes for ostomy care during patient's hospital stay. Patient denies any further needs or questions for ostomy nurse at this time. Provided patient with inpatient and outpatient ostomy nurse contact information should questions or needs arise while patient is hospitalized.     Recommendations:   -Albumin Levels (One Time)  -Most recent Albumin level 1.6  -Change pouching system every 2-3 days, PRN  -Aggressive preventive  measures, utilize PUP bundle  -Head of Bed less than 30 degrees when patient can tolerate to decrease friction and shear to coccyx area  -Care Management for SNF Placement, Home Health  -Follow up with outpatient ostomy clinic    Patient provides self care at home please continue to allow this while hospitalized.    Bedside nursing please continue to reinforce education and allow patient to assist with emptying and changing appliance as needed.     Order placed in [facility] for outpatient ostomy nurse visit once patient is discharged to coordinate with return patient visit.     Prescriptions for ostomy supplies will be placed under DME order by Care Management Prior to Discharge, please also work with patients preferred medical supply provider.     Supplies Needed upon discharge for ostomy care:  Hollister 2 3/4 two piece barrier #[id number]  Hollister 2 3/4 two piece drainable pouch [id number]    Accessories:  Hollister Adapt Stoma Powder # [phone]  Hollister Adapt Stoma Paste [phone]  Hollister Universal Remover Wipe [phone]    Interactions:     No new needs identified, please place consult  should additional needs arise, will sign off at this time.     Hayes Ludwig, RN  WOCN Team  [phone]

## 2016-11-30 NOTE — Care Plan (Signed)
Problem: Infection, Risk/Actual (Adult)  Goal: Infection Prevention/Resolution  Patient will demonstrate the desired outcomes by discharge/transition of care.   Outcome: Ongoing (see interventions/notes)      Problem: Mobility, Physical Impaired (Adult)  Goal: Enhanced Mobility Skills  Patient will demonstrate the desired outcomes by discharge/transition of care.   Outcome: Ongoing (see interventions/notes)    Goal: Enhanced Functional Ability  Patient will demonstrate the desired outcomes by discharge/transition of care.   Outcome: Ongoing (see interventions/notes)      Problem: Health Knowledge, Opportunity to Enhance (Adult,Obstetrics,Pediatric)  Goal: Knowledgeable about Health Subject/Topic  Patient will demonstrate the desired outcomes by discharge/transition of care.   Outcome: Ongoing (see interventions/notes)      Problem: Self-Care Deficit (Adult,Obstetrics,Pediatric)  Goal: Improved Ability to Perform BADL and IADL  Patient will demonstrate the desired outcomes by discharge/transition of care.   Outcome: Ongoing (see interventions/notes)      Problem: Wound (Includes Pressure Injury) (Adult)  Prevent and manage potential problems including:  1. bleeding  2. delayed wound healing  3. infection  4. pain  5. situational response  6. skin injury, new onset   Goal: Signs and Symptoms of Listed Potential Problems Will be Absent, Minimized or Managed (Wound)  Signs and symptoms of listed potential problems will be absent, minimized or managed by discharge/transition of care (reference Wound (Includes Pressure Injury) (Adult) CPG).   Outcome: Ongoing (see interventions/notes)      Problem: Depression (Adult,Obstetrics,Pediatric)  Goal: Identify Related Risk Factors and Signs and Symptoms  Related risk factors and signs and symptoms are identified upon initiation of Human Response Clinical Practice Guideline (CPG).   Outcome: Completed Date Met: 11/30/16    Goal: Establish/Maintain Self-Care Routine  Patient will  demonstrate the desired outcomes by discharge/transition of care.   Outcome: Ongoing (see interventions/notes)    Goal: Improved/Stable Mood  Patient will demonstrate the desired outcomes by discharge/transition of care.   Outcome: Ongoing (see interventions/notes)      Problem: Skin Injury Risk (Adult,Obstetrics,Pediatric)  Goal: Identify Related Risk Factors and Signs and Symptoms  Related risk factors and signs and symptoms are identified upon initiation of Human Response Clinical Practice Guideline (CPG).   Outcome: Completed Date Met: 11/30/16    Goal: Skin Health and Integrity  Patient will demonstrate the desired outcomes by discharge/transition of care.   Outcome: Ongoing (see interventions/notes)      Comments: Dressing changed to wounds as needed, new bed obtained will put patient in when patient bathes self. PRN pain medicine as needed.

## 2016-11-30 NOTE — Progress Notes (Signed)
[facility]  Medicine Progress Note  Full Code    [name]  Date of service: 11/30/2016    Subjective:  Patient is lying in bed comfortably, at the time of my examination, appears in no distress.      Patient asks about IV pain medications, and repeatedly notes that he would prefer to take IV morphine instead of oral opiates.    Does not report any other complaints at present.    Vital Signs:  Temp (24hrs) Max:39.3 C (283.6 F)      Systolic (62HUT), MLY:650 , Min:106 , PTW:656     Diastolic (81EXN), TZG:01, Min:51, Max:72    Temp  Avg: 38 C (100.4 F)  Min: 36.5 C (97.7 F)  Max: 39.3 C (102.7 F)  Pulse  Avg: 120.3  Min: 118  Max: 124  Resp  Avg: 19.3  Min: 18  Max: 20  SpO2  Avg: 98.3 %  Min: 97 %  Max: 100 %  Pain Score (Numeric, Faces): 7  Fi02    I/O:  I/O last 24 hours:    Intake/Output Summary (Last 24 hours) at 11/30/16 1001  Last data filed at 11/30/16 0700   Gross per 24 hour   Intake              375 ml   Output             1600 ml   Net            -1225 ml     I/O current shift:     Blood Sugars: Last Fingerstick:  No results found for: GLUCOSEPOC      Current Facility-Administered Medications:  acetaminophen (TYLENOL) tablet 650 mg Oral Q4H PRN   baclofen (LIORESAL) tablet 20 mg Oral 3x/day   ceFEPime (MAXIPIME) 2 g in iso-osmotic 100 mL premix IVPB 2 g Intravenous Q8H   DAPTOmycin (CUBICIN) 700 mg in NS 100 mL IVPB 8 mg/kg Intravenous Q24H   docusate sodium (COLACE) capsule 100 mg Oral 2x/day   enoxaparin PF (LOVENOX) 40 mg/0.4 mL SubQ injection 40 mg Subcutaneous Q24H   escitalopram (LEXAPRO) tablet 20 mg Oral Daily   furosemide (LASIX) tablet 20 mg Oral QAM   furosemide (LASIX) tablet 10 mg Oral QPM   morphine 4 mg/mL injection 4 mg Intravenous Q6H PRN   NS flush syringe 2 mL Intracatheter Q8HRS   And      NS flush syringe 2-6 mL Intracatheter Q1 MIN PRN   NS premix infusion  Intravenous Continuous   oxyCODONE (ROXICODONE) immediate release tablet 10 mg Oral Q4H PRN   pantoprazole  (PROTONIX) delayed release tablet 40 mg Oral Daily   pregabalin (LYRICA) capsule 150 mg 150 mg Oral 2x/day       No Known Allergies    Physical Exam:  General: Appears chronically ill.  Moderate distress.   Head: Normocephalic and atraumatic.  No obvious abnormality.  Eyes: Conjunctiva clear.  Pupils equal, round.  Extraocular eye movement intact.    ENT: Mouth mucous membranes moist. Pharynx without injection or exudate.  Trachea midline  Respiratory: Breathing non-labored. Clear to auscultation bilateral. No wheezes, rhonchi, or rales.  Cardiovascular: Tachycardic. Regular rhythm Normal S1/S2.   Gastrointestinal: Ostomy present. Soft, non-tender.  Bowel sounds normal. No hepatomegaly.  Extremities: 4+ dorsalis pedis, posterior tibial, femoral, and popliteal pulses bilateral.  No cyanosis or clubbing.  Skin: Bilateral stage IV sacral decubitus ulcers to bilateral hips. Stage IV coccyx ulcer.    Neurologic:  A/O x3.  Paraplegia lower extremities.    Psychiatric: Appears in good mood.  Fluent in speech and cognition.    Labs:  Lab Results Today:    Results for orders placed or performed during the hospital encounter of 11/29/16 (from the past 24 hour(s))   BASIC METABOLIC PANEL   Result Value Ref Range    SODIUM 133 (L) 136 - 145 mmol/L    POTASSIUM 3.8 3.5 - 5.1 mmol/L    CHLORIDE 100 96 - 111 mmol/L    CO2 TOTAL 26 22 - 32 mmol/L    ANION GAP 7 4 - 13 mmol/L    CALCIUM 8.5 8.5 - 10.2 mg/dL    GLUCOSE 94 65 - 139 mg/dL    BUN 16 8 - 25 mg/dL    CREATININE 0.88 0.62 - 1.27 mg/dL    BUN/CREA RATIO 18 6 - 22    ESTIMATED GFR >59 >59 mL/min/1.62m2   HEPATIC FUNCTION PANEL   Result Value Ref Range    ALBUMIN 1.6 (L) 3.5 - 5.0 g/dL    ALKALINE PHOSPHATASE 285 (H) <150 U/L    ALT (SGPT) 19 <55 U/L    AST (SGOT) 27 8 - 48 U/L    BILIRUBIN TOTAL 0.7 0.3 - 1.3 mg/dL    BILIRUBIN DIRECT 0.2 <0.3 mg/dL    PROTEIN TOTAL 7.6 6.4 - 8.3 g/dL   MAGNESIUM   Result Value Ref Range    MAGNESIUM 1.9 1.6 - 2.5 mg/dL   PHOSPHORUS   Result  Value Ref Range    PHOSPHORUS 3.2 2.4 - 4.7 mg/dL   CREATINE KINASE (CK), TOTAL, SERUM   Result Value Ref Range    CREATINE KINASE 57 45 - 225 U/L   PT/INR   Result Value Ref Range    PROTHROMBIN TIME 14.8 (H) 9.3 - 13.9 seconds    INR 1.28 (H) 0.80 - 1.20   LACTIC ACID   Result Value Ref Range    LACTIC ACID 1.5 0.5 - 2.2 mmol/L   CBC WITH DIFF   Result Value Ref Range    WBC 15.1 (H) 3.5 - 11.0 x10^3/uL    RBC 3.16 (L) 4.06 - 5.63 x10^6/uL    HGB 8.0 (L) 12.5 - 16.3 g/dL    HCT 26.2 (L) 36.7 - 47.0 %    MCV 82.8 78.0 - 100.0 fL    MCH 25.4 (L) 27.4 - 33.0 pg    MCHC 30.7 (L) 32.5 - 35.8 g/dL    RDW 19.8 (H) 12.0 - 15.0 %    PLATELETS 424 140 - 450 x10^3/uL    MPV 6.2 (L) 7.5 - 11.5 fL    NEUTROPHIL % 75 %    LYMPHOCYTE % 14 %    MONOCYTE % 9 %    EOSINOPHIL % 1 %    BASOPHIL % 0 %    NEUTROPHIL # 11.35 (H) 1.50 - 7.70 x10^3/uL    LYMPHOCYTE # 2.13 1.00 - 4.80 x10^3/uL    MONOCYTE # 1.35 (H) 0.30 - 1.00 x10^3/uL    EOSINOPHIL # 0.18 0.00 - 0.50 x10^3/uL    BASOPHIL # 0.07 0.00 - 0.20 x10^3/uL   VANCOMYCIN, RANDOM   Result Value Ref Range    VANCOMYCIN RANDOM 9.2   ug/mL   HEPATITIS C ANTIBODY SCREEN WITH REFLEX TO HCV PCR   Result Value Ref Range    HCV ANTIBODY QUALITATIVE Reactive (A) Negative   HIV1/HIV2 SCREEN, COMBINED ANTIGEN AND ANTIBODY   Result Value Ref Range  HIV SCREEN, COMBINED ANTIGEN & ANTIBODY Negative Negative       Radiology:    X-rays ordered, pending    Microbiology:   Blood cultures ordered, pending    PT/OT: Yes    Consults:  Plastic surgery    Hardware (lines, foley's, tubes):  Peripheral IV lines    Assessment/ Plan:   Active Hospital Problems    Diagnosis   . Decubitus ulcer      36 year old male with a history of extensive decubitus ulcers, presented with sepsis and infected decubitus ulcers    Sepsis  SIRS Criteria met with source of infection identified  Sacral Decubitus Stage 4 ulcers, bilateral, infected  Tachycardia, Elevated White count, Fever on presentation  Daptomycin and  Cefepime started  Plastic surgery team consulted, patient is not a surgical candidate for a flap procedure, but will be considered for bilateral amputation and dysuria arrangement per Plastic surgery   Will consider Orthopedics consult in a.m.  Workup initiated for alternate source of infection including urine culture, blood cultures, chest x-ray  Patient is s/p ileostomy for decubitus ulcers  CRP, Procalcitonin ordered  NS at 75/hr  PRN Roxicodone and Morphne for pain control  Bowel regimen ordered with Miralax and Senokot  Decubitus ulcer precautions, frequent repositioning   blood cultures ordered, pending   lactate 1.5    Drug-seeking behavior   Patient is preoccupied with IV pain medications, and I am concerned about addiction potential in this patient   Counseled patient extensively regarding pain medications   Will obtain pain management team consult tomorrow   Will manage pain with oral opiates    Severe protein- calorie malnutrition  Albumin 1.6 on presentation  Pre albumin ordered  Likely contributing to poor wound healing  MNT protocol and nutrition consult    Hepatitis C   Hep C screen with PCR reflex ordered  HIV screen ordered, pending  Patient reports that he has not been treated for hepatitis-C in the past  Will establish with [facility] on discharge    DVT/PE Prophylaxis: Heparin    Disposition Planning: To be decided, pending PT/OT     Alexis Goodell, MD      I saw and examined the patient.  I reviewed the resident's note.  I agree with the findings and plan of care as documented in the resident's note.  Any exceptions/additions are edited/noted.    Patient is showing signs concerning for addiction potential.  He is preoccupied with IV pain medication and the equivalent to oral dosing.  He appeared very comfortable and hiding under the covers during the exam and appeared free of any pain.    Randie Heinz, DO  Assistant Professor   Department of Internal Medicine  [facility]

## 2016-11-30 NOTE — Nurses Notes (Signed)
Patient continues to be upset about not being able to receive IV morphine and is requesting to speak with a doctor so that he can leave. Service text paged and informed of matter, also continues to refuse to have vital signs taken

## 2016-11-30 NOTE — Care Plan (Signed)
[place]    [facility]  Medical Nutrition Therapy Screen Note                                      Date of Service: 11/30/2016    Reason for Note: Consult: decubitus ulcer    Reviewed patient status, diet order/TF/TPN, labs and medications.  Height: 182.9 cm (6')   Weight: 90.5 kg (199 lb 8.3 oz) (11/29/16 2231)   Body mass index is 27.06 kg/(m^2).    Brief Subjective:   Pt with stage IV decubitus ulcer to sacrum and coccyx. Hx of hepatitis C and paraplegia.     Most Recent Screen Previous screen   Impaired Nutrition Status Score Impaired Nutrition Status Score: 1 - Food intake below 50 - 75% of normal requirement in preceding week - Mild (11/30/16 1000)   Impaired Nutrition Status Score: 1 - Food intake below 50 - 75% of normal requirement in preceding week - Mild (11/30/16 1000)           Severity of Disease Score Severity of Disease Score: 2 -* Pressure ulcer Stage 3 or Stage 4 - Moderate (11/30/16 1000)   Severity of Disease Score: 2 -* Pressure ulcer Stage 3 or Stage 4 - Moderate (11/30/16 1000)       Age Age: 36 - < 70 years (11/30/16 1000) Age: 36 - < 70 years (11/30/16 1000)     Score Score: 3 (11/30/16 1000)     Score: 3 (11/30/16 1000)   Risk Level Risk Level: Level 3 - 3 pts - Screen note required. Follow-up within 5 days (11/30/16 1000)   Risk Level: Level 3 - 3 pts - Screen note required. Follow-up within 5 days (11/30/16 1000)           Nutrition related problems: Stage IV wounds    Assessment: Needs assessed/reassessed      Estimated kcal needs:   3150-3600 (35-40 kcal/90 kg BW)  Estimated protein needs: 123-239 (1.6-1.8gm/77 kg IBW)  Estimated fluid needs:  2700-3150 ml (30-35 ml/90 kg BW)    Monitor: Tolerance of diet    Plan/Intervention:  1. Advance diet to regular- high kcal/protein  2. Add Juven BID to promote wound healing  3. Add 500 mg BID of vitamin C x30 days, 220 mg zinc once per day x10 days, and 10,000 IU vitamin A once per day x10 days to promote wound  healing  4. Encourage high protein snacks between meals  5. Monitor weight twice per week - standing if able      RD to provide full assessment 9/24.      Ritta Slot, RDLD  11/30/2016, 10:34

## 2016-11-30 NOTE — Nurses Notes (Signed)
CXR completed. Service made aware.
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
## 2016-11-30 NOTE — Nurses Notes (Signed)
Service text paged regarding patient refusing to have his vital sings taken at 1100

## 2016-11-30 NOTE — Nurses Notes (Signed)
Medications given per orders, assessment completed and charted per flow sheet. Dressing to bilateral hips changed by plastics at bedside. Patient requesting pain medications, offered patient available pain meds based on PRN orders, patient stated that PO pain pills did not work for him and he was requesting PRN IV morphine explained to patient that PRN IV morphine order was only available to give if he was unable to take PO patient became upset and asked for someone else that could give him his PRN IV morphine. Writer asked patient again if he wanted the PRN pain medicine that was available to give and he again declined and asked Clinical research associate to get [employer] of service. Service text paged of matter and informed

## 2016-12-01 ENCOUNTER — Other Ambulatory Visit (HOSPITAL_COMMUNITY): Payer: Self-pay

## 2016-12-01 DIAGNOSIS — R768 Other specified abnormal immunological findings in serum: Secondary | ICD-10-CM | POA: Diagnosis present

## 2016-12-01 DIAGNOSIS — A419 Sepsis, unspecified organism: Secondary | ICD-10-CM | POA: Diagnosis present

## 2016-12-01 DIAGNOSIS — Z765 Malingerer [conscious simulation]: Secondary | ICD-10-CM | POA: Diagnosis present

## 2016-12-01 DIAGNOSIS — L89154 Pressure ulcer of sacral region, stage 4: Secondary | ICD-10-CM

## 2016-12-01 LAB — URINALYSIS, MACROSCOPIC
BILIRUBIN: NEGATIVE mg/dL
BLOOD: NEGATIVE mg/dL
COLOR: NORMAL
GLUCOSE: NEGATIVE mg/dL
KETONES: 5 mg/dL — AB
NITRITE: NEGATIVE
PH: 5 (ref 5.0–8.0)
PROTEIN: 30 mg/dL — AB
SPECIFIC GRAVITY: 1.029 (ref 1.005–1.030)
UROBILINOGEN: NEGATIVE mg/dL

## 2016-12-01 LAB — BASIC METABOLIC PANEL
ANION GAP: 6 mmol/L (ref 4–13)
BUN/CREA RATIO: 18 (ref 6–22)
BUN: 18 mg/dL (ref 8–25)
CALCIUM: 8.7 mg/dL (ref 8.5–10.2)
CHLORIDE: 106 mmol/L (ref 96–111)
CO2 TOTAL: 22 mmol/L (ref 22–32)
CREATININE: 1.01 mg/dL (ref 0.62–1.27)
ESTIMATED GFR: >59 mL/min/1.73mˆ2 (ref >59)
GLUCOSE: 70 mg/dL (ref 65–139)
POTASSIUM: 3.7 mmol/L (ref 3.5–5.1)
SODIUM: 134 mmol/L — ABNORMAL LOW (ref 136–145)

## 2016-12-01 LAB — ECG 12-LEAD
Atrial Rate: 119 {beats}/min
Calculated P Axis: 62 degrees
Calculated R Axis: 47 degrees
Calculated T Axis: 43 degrees
PR Interval: 128 ms
QRS Duration: 84 ms
QT Interval: 322 ms
QTC Calculation: 452 ms
Ventricular rate: 119 {beats}/min

## 2016-12-01 LAB — CBC WITH DIFF
BASOPHIL #: 0.05 x10ˆ3/uL (ref 0.00–0.20)
BASOPHIL %: 1 %
BASOPHIL %: 1 %
EOSINOPHIL #: 0.24 x10ˆ3/uL (ref 0.00–0.50)
EOSINOPHIL %: 2 %
HCT: 24.2 % — ABNORMAL LOW (ref 36.7–47.0)
HGB: 7.5 g/dL — ABNORMAL LOW (ref 12.5–16.3)
LYMPHOCYTE #: 1.25 x10ˆ3/uL (ref 1.00–4.80)
LYMPHOCYTE %: 12 %
MCH: 25.2 pg — ABNORMAL LOW (ref 27.4–33.0)
MCHC: 30.8 g/dL — ABNORMAL LOW (ref 32.5–35.8)
MCV: 81.7 fL (ref 78.0–100.0)
MONOCYTE #: 1.03 x10ˆ3/uL — ABNORMAL HIGH (ref 0.30–1.00)
MONOCYTE %: 10 %
MPV: 6.5 fL — ABNORMAL LOW (ref 7.5–11.5)
NEUTROPHIL #: 7.62 x10ˆ3/uL (ref 1.50–7.70)
NEUTROPHIL %: 75 %
PLATELETS: 406 x10ˆ3/uL (ref 140–450)
RBC: 2.96 x10ˆ6/uL — ABNORMAL LOW (ref 4.06–5.63)
RDW: 19.4 % — ABNORMAL HIGH (ref 12.0–15.0)
WBC: 10.2 x10ˆ3/uL (ref 3.5–11.0)

## 2016-12-01 LAB — URINE CULTURE,ROUTINE: URINE CULTURE: <10000

## 2016-12-01 LAB — MAGNESIUM: MAGNESIUM: 1.9 mg/dL (ref 1.6–2.5)

## 2016-12-01 LAB — HGA1C (HEMOGLOBIN A1C WITH EST AVG GLUCOSE)
ESTIMATED AVERAGE GLUCOSE: 85 mg/dL
HEMOGLOBIN A1C: 4.6 % (ref 4.0–6.0)

## 2016-12-01 LAB — PHOSPHORUS: PHOSPHORUS: 3.8 mg/dL (ref 2.4–4.7)

## 2016-12-01 MED ORDER — SODIUM HYPOCHLORITE 0.0125 % TOPICAL SOLUTION
Freq: Two times a day (BID) | CUTANEOUS | Status: DC
Start: 2016-12-01 — End: 2016-12-08
  Administered 2016-12-01 – 2016-12-06 (×3): 0

## 2016-12-01 MED ORDER — SODIUM HYPOCHLORITE 0.125 % SOLUTION
Freq: Two times a day (BID) | Status: DC
Start: 2016-12-01 — End: 2016-12-08
  Administered 2016-12-01 – 2016-12-06 (×4): 0
  Filled 2016-12-01 (×6): qty 473

## 2016-12-01 MED ORDER — LANOLIN-OXYQUIN-PET, HYDROPHIL TOPICAL OINTMENT
TOPICAL_OINTMENT | Freq: Four times a day (QID) | CUTANEOUS | Status: DC
Start: 2016-12-01 — End: 2016-12-08
  Administered 2016-12-01 – 2016-12-04 (×3): 0 via TOPICAL
  Filled 2016-12-01 (×2): qty 1

## 2016-12-01 MED ORDER — BACITRACIN ZINC 500 UNIT/GRAM TOPICAL OINTMENT IN PACKET
1.0000 | TOPICAL_OINTMENT | Freq: Two times a day (BID) | CUTANEOUS | Status: DC
Start: 2016-12-01 — End: 2016-12-08
  Administered 2016-12-01 – 2016-12-08 (×14): 1 via TOPICAL

## 2016-12-01 MED ADMIN — ascorbic acid (vitamin C) 500 mg tablet: ORAL | @ 22:00:00

## 2016-12-01 MED ADMIN — Medication: INTRAVENOUS | @ 03:00:00

## 2016-12-01 MED ADMIN — lactated Ringers intravenous solution: INTRAVENOUS | @ 18:00:00 | NDC 00338011704

## 2016-12-01 MED ADMIN — albumin, human 5 % intravenous solution: SUBCUTANEOUS | @ 06:00:00 | NDC 00944049101

## 2016-12-01 NOTE — Nurses Notes (Signed)
Labs and blood cultures and u/a obtained. Picc Line dressing changed, caps changed. Both lumens difficulty to flush, will not draw.
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
## 2016-12-01 NOTE — Care Plan (Signed)
Problem: Infection, Risk/Actual (Adult)  Goal: Infection Prevention/Resolution  Patient will demonstrate the desired outcomes by discharge/transition of care.   Outcome: Ongoing (see interventions/notes)      Problem: Mobility, Physical Impaired (Adult)  Goal: Enhanced Mobility Skills  Patient will demonstrate the desired outcomes by discharge/transition of care.   Outcome: Ongoing (see interventions/notes)    Goal: Enhanced Functional Ability  Patient will demonstrate the desired outcomes by discharge/transition of care.   Outcome: Ongoing (see interventions/notes)      Problem: Wound (Includes Pressure Injury) (Adult)  Prevent and manage potential problems including:  1. bleeding  2. delayed wound healing  3. infection  4. pain  5. situational response  6. skin injury, new onset   Goal: Signs and Symptoms of Listed Potential Problems Will be Absent, Minimized or Managed (Wound)  Signs and symptoms of listed potential problems will be absent, minimized or managed by discharge/transition of care (reference Wound (Includes Pressure Injury) (Adult) CPG).   Outcome: Ongoing (see interventions/notes)      Problem: Patient Care Overview (Adult,OB)  Goal: Plan of Care Review(Adult,OB)  The patient and/or their representative will communicate an understanding of their plan of care   Outcome: Ongoing (see interventions/notes)  Wound care as ordered by service.  Frequent weight shifting encouraged.    Patient in bed with trapeze to promote mobility.    Continues IV antibiotics for treatment of sepsis.    Goal: Individualization/Patient Specific Goal(Adult/OB)  Outcome: Ongoing (see interventions/notes)    Goal: Interdisciplinary Rounds/Family Conf  Outcome: Ongoing (see interventions/notes)

## 2016-12-01 NOTE — Nurses Notes (Signed)
Patient received from observation in room 824.
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
## 2016-12-01 NOTE — Nurses Notes (Signed)
Pt leaving unit at this time to 8 west.

## 2016-12-01 NOTE — Nurses Notes (Addendum)
Came and checked on the IV as the machine was beeping, kinking noted - tried to fix the line but the patient told me to "Fuck off and get out of here." Will continue to monitor.    Cristino Martes, RN  12/01/2016, 03:28

## 2016-12-01 NOTE — Nurses Notes (Signed)
Assessment per flow sheet, vitals stable, ostomy appliance changed, patient encouraged to use call light, will continue to monitor

## 2016-12-01 NOTE — Nurses Notes (Signed)
Approached the patient again for his morning pills and an attempt to obtain his vital signs, appears to be more pleasant compared to the previous encounter. He allowed the writer to take his VS and give his morning medications. Inquired about if he remember saying inappropriate words and being non compliant earlier on - said he does not remember and laugh. He then apologized. Rooms looking cluttered again even after de cluttering overnight. Will be monitoring for the day.    Cristino Martes, RN  12/01/2016, [phone]

## 2016-12-01 NOTE — Care Plan (Signed)
Problem: Patient Care Overview (Adult,OB)  Goal: Plan of Care Review(Adult,OB)  The patient and/or their representative will communicate an understanding of their plan of care  Outcome: Ongoing (see interventions/notes)  WOUND CARE MANAGEMENT.  Goal: Individualization/Patient Specific Goal(Adult/OB)  Outcome: Ongoing (see interventions/notes)    Goal: Interdisciplinary Rounds/Family Conf  Outcome: Ongoing (see interventions/notes)

## 2016-12-01 NOTE — Nurses Notes (Signed)
Changes noted to the size of patients ostomy site.  Service notified and orders received for Ostomy nurse to evaluate the patient tomorrow.

## 2016-12-01 NOTE — Nurses Notes (Signed)
Able to empty the urinary catheter bag - output as 1,29mL

## 2016-12-01 NOTE — Care Plan (Signed)
[facility]  Rehabilitation Services  Physical Therapy Initial Evaluation    Patient Name: [name]  Date of Birth: 17-Aug-1980  Height: Height: 182.9 cm (6')  Weight: Weight: 90.5 kg (199 lb 8.3 oz)  Room/Bed: 824/A  Payor: MEDICARE / Plan: MEDICARE PART A AND B / Product Type: Medicare /     Assessment:      Pt with good tolerance to PT evaluation and motivation to participate. Pt is limited by inability to transfer secondary to stage IV ulcers, knee contractures bilaterally, and overall LE muscle tightness d/t infrequent positional changes. Recommend SNF d/t patient not being safe for transfers, proper wound care management, and continued mobility to return to prior level of function. Plan to work on BUE strength, BLE stretching, and transfer training pending progress to maximize functional recovery.     Discharge Needs:    Equipment Recommendation: TBD     Discharge Disposition: skilled nursing facility vs. LTAC    JUSTIFICATION OF DISCHARGE RECOMMENDATION   Based on current diagnosis, functional performance prior to admission, and current functional performance, this patient requires continued PT services in skilled nursing facility in order to achieve significant functional improvements in these deficit areas: aerobic capacity/endurance, gait, locomotion, and balance, neuromuscular, posture, ROM (range of motion).    Plan:   Current Intervention: balance training, bed mobility training, patient/family education, ROM (range of motion), strengthening, transfer training, wheelchair management/propulsion training  To provide physical therapy services minimum of 3x/week  for duration of length of stay.    The risks/benefits of therapy have been discussed with the patient/caregiver and he/she is in agreement with the established plan of care.       Subjective & Objective      12/01/16 1420   Therapist Pager   PT Assigned/ Pager # Judeth Cornfield [phone]/ Evangelina Delancey [phone]   Rehab Session   Document Type evaluation    Total PT Minutes: 28   Patient Effort good   Symptoms Noted During/After Treatment none   Symptoms Noted Comment Pt reported nerve pain in bilateral LE.    General Information   Patient Profile Reviewed? yes   Onset of Illness/Injury or Date of Surgery 11/29/16   Pertinent History of Current Functional Problem [name] is a 36 y.o., Black/African American male who presents with Bilateral stage IV sacral decubitus ulcer and stage IV coccyx ulcer (all present on admit) in setting of chronic osteomyelitis   Medical Lines Telemetry;PIV Line;Peripheral Drain   Respiratory Status room air   Existing Precautions/Restrictions full code;contact isolation   Mutuality/Individual Preferences   Patient Specific Goals (Include Timeframe) Go to a facility with more assistance; Not return home   What Anxieties, Fears, Concerns, or Questions Do You Have About Your Care? Concerned about WC that was left at home.   Mutuality Comment Pt agreeable to PT evaluation, cleared for participation per RN.   Living Environment   Lives With alone;significant other   Living Arrangements assisted living   Living Environment Comment Prior to hospital admission, pt was residing at [facility]. Pt reports living in a trailer with significant other prior to [facility] stay; however, significant other not dependable to assist with needs. Pt not able to get into trailer without assistance d/t stairs.    Functional Level Prior   Ambulation 1 - assistive equipment   Transferring 1 - assistive equipment   Toileting 1 - assistive equipment   Bathing 1 - assistive equipment   Dressing 1 - assistive equipment   Eating 1 -  assistive equipment   Communication 0 - understands/communicates without difficulty   Prior Functional Level Comment Pt reports using WC for mobility and being independent prior to hospital admission, only needing assistance with wound dressings and transportation.    Self-Care   Equipment Currently Used at Home yes   Equipment Currently Used at [employer] [facility] Hospital none   Pre Treatment Status   Pre Treatment Patient Status Patient supine in bed;Call light within reach;Telephone within reach   Support Present Pre Treatment  None   Communication Pre Treatment  Nurse   Cognitive Assessment/Interventions   Behavior/Mood Observations behavior appropriate to situation, WNL/WFL;cooperative   Orientation Status  oriented x 4   Attention WNL/WFL   Follows Commands  WNL   Pain Assessment   Pre/Post Treatment Pain Comment No c/o pain.   RUE Assessment   RUE Assessment WFL- Within Functional Limits   LUE Assessment   LUE Assessment WFL- Within Functional Limits   RLE Assessment   RLE Assessment X-Exceptions   RLE ROM Knee ROM limited by flexion contracture (lacking ~70 degrees of knee extension); DF neutral; hip flexor tightness observed   RLE Strength No motor function observed throughout   RLE Sensation light touch sensation deficits identified  (absent throughout)   RLE Other pt reports nerve related pain   LLE Assessment   LLE Assessment X-Exceptions   LLE ROM Knee ROM limited by flexion contracture (lacking ~70 degrees knee extension); DF neutral; hip flexor tightness observed   LLE Strength No motor function observed throughout   LLE Sensation light touch sensation deficits identified  (absent throughout)   LLE Other pt reports nerve related pain   Trunk Assessment   Trunk Assessment WFL-Within Functional Limits   Mobility Assessment/Training   Additional Documentation Bed Mobility Assessment/Treatment (Group)   Bed Mobility Assessment/Treatment   Comment  Pt able to sit upright in hospital bed independently with use of overhead trapeze    Balance Skill Training   Comment pt able to sit upright in hospital bed with use of overhead trapeze and remain sitting with good balance.    Sitting Balance: Static good balance   Sitting, Dynamic (Balance) good balance   Systems Impairment Contributing to Balance Disturbance musculoskeletal   Identified  Impairments Contributing to Balance Disturbance abnormal muscle tone;impaired motor control;decreased sensation;decreased strength   Post Treatment Status   Post Treatment Patient Status Patient supine in bed;Call light within reach;Telephone within reach   Support Present Post Treatment  Nurse present   Communication Post Treatement Nurse   Plan of Care Review   Plan Of Care Reviewed With patient   Physical Therapy Clinical Impression   Assessment Pt with good tolerance to PT evaluation and motivation to participate. Pt is limited by inability to transfer secondary to stage IV ulcers, knee contractures bilaterally, and overall LE muscle tightness d/t infrequent positional changes. Recommend SNF d/t patient not being safe for transfers, proper wound care management, and continued mobility to return to prior level of function. Plan to work on BUE strength, BLE stretching, and transfer training pending progress to maximize functional recovery.    Criteria for Skilled Therapeutic yes;treatment indicated   Pathology/Pathophysiology Noted neuromuscular;musculoskeletal   Impairments Found (describe specific impairments) aerobic capacity/endurance;gait, locomotion, and balance;neuromuscular;posture;ROM (range of motion)   Functional Limitations in Following  self-care;home management;community/leisure   Disability: Inability to Perform community/leisure   Rehab Potential good, to achieve stated therapy goals   Therapy Frequency minimum of 3x/week  Predicted Duration of Therapy Intervention (days/wks) length of stay   Anticipated Equipment Needs at Discharge (PT Clinical Impression) TBD   Anticipated Discharge Disposition  skilled nursing facility   Highest level of Mobility score   Exercise Level 2- Turned self in bed/ROM   Evaluation Complexity Justification   Patient History: Co-morbity/factors that Impact Plan of Care 3 or more that implact Plan of Care   Examination Components 4 or more Exam elements addressed    Presentation Evolving: Symptoms, complaints, characteristics of condition changing &/or cognitive deficits present   Clinical Decision Making Moderate complexity   Evaluation Complexity Moderate complexity   Care Plan Goals   PT Rehab Goals Bed Mobility Goal;Transfer Training Goal   Bed Mobility Goal   Bed Mobility Goal, Date Established 12/01/16   Bed Mobility Goal, Time to Achieve by discharge   Bed Mobility Goal, Activity Type roll left/roll right;scoot/bridge;supine to sit/sit to supine   Bed Mobility Goal, Independence Level modified independence   Transfer Training Goal   Transfer Training Goal, Date Established 12/01/16   Transfer Training Goal, Time to Achieve by discharge   Transfer Training Goal, Activity Type bed-to-chair/chair-to-bed   Transfer Training Goal, Independence Level modified independence   Transfer Training Goal, Additional Goal With use of WC   Planned Therapy Interventions, PT Eval   Planned Therapy Interventions (PT Eval) balance training;bed mobility training;patient/family education;ROM (range of motion);strengthening;transfer training;wheelchair management/propulsion training       Therapist:   Letta Pate, PHYSICAL THERAPY STUDENT   Pager #: [phone]

## 2016-12-01 NOTE — Nurses Notes (Signed)
Orders obtained for step down bed. Pt made aware. He is a&o in stable condition. Denies pain at this time. Fall precautions maintained. Call bell in reach.

## 2016-12-01 NOTE — Progress Notes (Signed)
[facility]  Medicine Progress Note  Full Code    [name]  Date of service: 12/01/2016    Subjective:      Patient is in a pleasant mood today, and reports that he is doing well.  Asks about high-protein diet.  Does not report any new complaints.  Expresses concern regarding wound healing.      No acute overnight events    Vital Signs:  Temp (24hrs) Max:39.4 C (161.0 F)      Systolic (96EAV), WUJ:811 , Min:87 , BJY:782     Diastolic (95AOZ), HYQ:65, Min:46, Max:71    Temp  Avg: 37.4 C (99.4 F)  Min: 36.4 C (97.5 F)  Max: 39.4 C (102.9 F)  Pulse  Avg: 106.8  Min: 93  Max: 121  Resp  Avg: 19.3  Min: 17  Max: 22  SpO2  Avg: 96.2 %  Min: 89 %  Max: 100 %  MAP (Non-Invasive)  Avg: 79.5 mmHG  Min: 77 mmHG  Max: 82 mmHG  Pain Score (Numeric, Faces): 0  Fi02    I/O:  I/O last 24 hours:      Intake/Output Summary (Last 24 hours) at 12/01/16 1746  Last data filed at 12/01/16 1500   Gross per 24 hour   Intake             1815 ml   Output             1800 ml   Net               15 ml     I/O current shift:     Blood Sugars: Last Fingerstick:  No results found for: GLUCOSEPOC      Current Facility-Administered Medications:  acetaminophen (TYLENOL) tablet 650 mg Oral Q6HRS   ascorbic acid (VITAMIN C) tablet 500 mg Oral 2x/day   baclofen (LIORESAL) tablet 20 mg Oral 3x/day   ceFEPime (MAXIPIME) 2 g in iso-osmotic 100 mL premix IVPB 2 g Intravenous Q8H   DAPTOmycin (CUBICIN) 700 mg in NS 100 mL IVPB 8 mg/kg Intravenous Q24H   enoxaparin PF (LOVENOX) 40 mg/0.4 mL SubQ injection 40 mg Subcutaneous Q24H   escitalopram (LEXAPRO) tablet 20 mg Oral Daily   furosemide (LASIX) tablet 20 mg Oral QAM   furosemide (LASIX) tablet 10 mg Oral QPM   influenza virus vaccine (PF) IM injection (FLUARIX for ages 57 months and older) 0.5 mL Intramuscular Once   ketorolac (TORADOL) 30 mg/mL injection 30 mg Intravenous Q8HRS   lanolin-oxyquin-pet, hydrophil (BAG BALM) topical ointment  Apply Topically 4x/day   morphine 4 mg/mL  injection 4 mg Intravenous Q6H PRN   NS flush syringe 2 mL Intracatheter Q8HRS   And      NS flush syringe 2-6 mL Intracatheter Q1 MIN PRN   NS premix infusion  Intravenous Continuous   oxyCODONE (ROXICODONE) immediate release tablet 15 mg Oral Q4H PRN   Or      oxyCODONE (ROXICODONE) immediate release tablet 20 mg Oral Q4H PRN   pantoprazole (PROTONIX) delayed release tablet 40 mg Oral Daily   perflutren lipid microspheres (DEFINITY) 1.1 mg/mL injection 0.3 mL Intravenous Give in Cardiology   pregabalin (LYRICA) capsule 150 mg 150 mg Oral 2x/day   sennosides-docusate sodium (SENOKOT-S) 8.6-50mg per tablet 1 Tab Oral 2x/day   sodium hypochlorite (DAKINS) 0.125% (quarter strength) irrigation  Irrigation Q12H   sodium hypochlorite 0.0125% (WOUNDCLENZ OTC) irrigation  Irrigation Q12H   zinc sulfate (ZINCATE) 220 mg (50 mg  elemental zinc) tablet 220 mg Oral Daily       No Known Allergies    Physical Exam:  General: Appears chronically ill.  Moderate distress.   Head: Normocephalic and atraumatic.  No obvious abnormality.  Eyes: Conjunctiva clear.  Pupils equal, round.  Extraocular eye movement intact.    ENT: Mouth mucous membranes moist. Pharynx without injection or exudate.  Trachea midline  Respiratory: Breathing non-labored. Clear to auscultation bilateral. No wheezes, rhonchi, or rales.  Cardiovascular: Tachycardic. Regular rhythm Normal S1/S2.   Gastrointestinal: Ostomy present. Soft, non-tender.  Bowel sounds normal. No hepatomegaly.  Extremities: 4+ dorsalis pedis, posterior tibial, femoral, and popliteal pulses bilateral.  No cyanosis or clubbing.  Skin: Bilateral stage IV sacral decubitus ulcers to bilateral hips. Stage IV coccyx ulcer.    Neurologic: A/O x3.  Paraplegia lower extremities.    Psychiatric: Appears in good mood.  Fluent in speech and cognition.    Labs:  Lab Results Today:    Results for orders placed or performed during the hospital encounter of 11/29/16 (from the past 24 hour(s))      PROCALCITONIN   Result Value Ref Range    PROCALCITONIN SERUM 2.66 (H) <0.50 ng/mL   ADULT ROUTINE BLOOD CULTURE, SET OF 2 BOTTLES (BACTERIA AND YEAST)   Result Value Ref Range    BLOOD CULTURE, ROUTINE Abnormal Stain (AA)     GRAM STAIN Gram Positive Cocci/Clusters    URINALYSIS, MICROSCOPIC   Result Value Ref Range    WBCS 14.0 (H) <4.0 /hpf    RBCS 5.0 <6.0 /hpf    BACTERIA Occasional or less Occasional or less /hpf    HYALINE CASTS 3.0 <4.0 /lpf    SQUAMOUS EPITHELIAL CELLS Occasional or less Occasional or less /lpf    MUCOUS Light Light /lpf   ADULT ROUTINE BLOOD CULTURE, SET OF 2 BOTTLES (BACTERIA AND YEAST)   Result Value Ref Range    BLOOD CULTURE, ROUTINE Abnormal Stain (AA)     GRAM STAIN Gram Positive Cocci/Clusters    URINE CULTURE,ROUTINE   Result Value Ref Range    URINE CULTURE       <10000 Low number of organisms present is of unclear significance.  Recommend repeat collection, preferably by straight catheterization, if clinically indicated.   BASIC METABOLIC PANEL   Result Value Ref Range    SODIUM 134 (L) 136 - 145 mmol/L    POTASSIUM 3.7 3.5 - 5.1 mmol/L    CHLORIDE 106 96 - 111 mmol/L    CO2 TOTAL 22 22 - 32 mmol/L    ANION GAP 6 4 - 13 mmol/L    CALCIUM 8.7 8.5 - 10.2 mg/dL    GLUCOSE 70 65 - 139 mg/dL    BUN 18 8 - 25 mg/dL    CREATININE 1.01 0.62 - 1.27 mg/dL    BUN/CREA RATIO 18 6 - 22    ESTIMATED GFR >59 >59 mL/min/1.35m   MAGNESIUM   Result Value Ref Range    MAGNESIUM 1.9 1.6 - 2.5 mg/dL   PHOSPHORUS   Result Value Ref Range    PHOSPHORUS 3.8 2.4 - 4.7 mg/dL   CBC WITH DIFF   Result Value Ref Range    WBC 10.2 3.5 - 11.0 x103/uL    RBC 2.96 (L) 4.06 - 5.63 x106/uL    HGB 7.5 (L) 12.5 - 16.3 g/dL    HCT 24.2 (L) 36.7 - 47.0 %    MCV 81.7 78.0 - 100.0 fL    MCH 25.2 (  L) 27.4 - 33.0 pg    MCHC 30.8 (L) 32.5 - 35.8 g/dL    RDW 19.4 (H) 12.0 - 15.0 %    PLATELETS 406 140 - 450 x103/uL    MPV 6.5 (L) 7.5 - 11.5 fL    NEUTROPHIL % 75 %    LYMPHOCYTE % 12 %    MONOCYTE % 10 %    EOSINOPHIL  % 2 %    BASOPHIL % 1 %    NEUTROPHIL # 7.62 1.50 - 7.70 x103/uL    LYMPHOCYTE # 1.25 1.00 - 4.80 x103/uL    MONOCYTE # 1.03 (H) 0.30 - 1.00 x103/uL    EOSINOPHIL # 0.24 0.00 - 0.50 x103/uL    BASOPHIL # 0.05 0.00 - 0.20 x103/uL   URINALYSIS, MACROSCOPIC   Result Value Ref Range    SPECIFIC GRAVITY 1.029 1.005 - 1.030    GLUCOSE Negative Negative mg/dL    PROTEIN 30  (A) Negative mg/dL    BILIRUBIN Negative Negative mg/dL    UROBILINOGEN Negative Negative mg/dL    PH 5.0 5.0 - 8.0    BLOOD Negative Negative mg/dL    KETONES 5  (A) Negative mg/dL    NITRITE Negative Negative    LEUKOCYTES Trace (A) Negative WBCs/uL    APPEARANCE Clear Clear    COLOR Normal (Yellow) Normal (Yellow)       Radiology:    X-rays ordered, pending    Microbiology:   Blood cultures ordered, 3/4  Positive for gram-positive cocci in clusters    PT/OT: Yes    Consults:  Plastic surgery    Hardware (lines, foley's, tubes):  Peripheral IV lines    Assessment/ Plan:   Active Hospital Problems    Diagnosis    Primary Problem: Sepsis (CMS HCC)    Hepatitis C antibody positive in blood    Drug-seeking behavior    Decubitus ulcer      36 year old male with a history of extensive decubitus ulcers, presented with sepsis and infected decubitus ulcers    Sepsis   SIRS Criteria met with source of infection identified  Sacral Decubitus Stage 4 ulcers, bilateral, infected  Tachycardia, Elevated White count, Fever on presentation  Daptomycin and Cefepime started  Plastic surgery team consulted, patient is not a surgical candidate for a flap procedure, no surgical intervention planned by Plastic surgery   Will consider Orthopedics consult in a.m.  Patient is s/p ileostomy for decubitus ulcers  CRP, Procalcitonin  elevated  NS at 75/hr  PRN Roxicodone for pain control  Bowel regimen ordered with Miralax and Senokot  Decubitus ulcer precautions, frequent repositioning   blood cultures 3/4 bottles positive for gram-positive cocci in clusters   lactate  1.5    Drug-seeking behavior   Patient is preoccupied with IV pain medications, and I am concerned about addiction potential in this patient   Counseled patient extensively regarding pain medications   Pain management team consulted  Will manage pain with oral opiates    Status post ileostomy  Ileostomy site healthy  Ostomy consult generated  Patient education ordered    Severe protein- calorie malnutrition  Albumin 1.6 on presentation  Pre albumin ordered  Likely contributing to poor wound healing  MNT protocol and nutrition consult    Hepatitis C   Hep C screen positive  HIV screen ordered, pending  Patient reports that he has not been treated for hepatitis-C in the past  [facility] on discharge  DVT/PE Prophylaxis: Heparin    Disposition Planning: To be decided, pending PT/OT     Alexis Goodell, MD    I saw and examined the patient.  I reviewed the resident's note.  I agree with the findings and plan of care as documented in the resident's note.  Any exceptions/additions are edited/noted.    Annamary Rummage Carducci, DO

## 2016-12-02 ENCOUNTER — Encounter (HOSPITAL_COMMUNITY): Payer: Self-pay | Admitting: Infection Control

## 2016-12-02 ENCOUNTER — Inpatient Hospital Stay (HOSPITAL_COMMUNITY): Payer: Medicare Other

## 2016-12-02 DIAGNOSIS — M8660 Other chronic osteomyelitis, unspecified site: Secondary | ICD-10-CM

## 2016-12-02 DIAGNOSIS — B9562 Methicillin resistant Staphylococcus aureus infection as the cause of diseases classified elsewhere: Secondary | ICD-10-CM

## 2016-12-02 DIAGNOSIS — I38 Endocarditis, valve unspecified: Secondary | ICD-10-CM

## 2016-12-02 LAB — URINE CULTURE: URINE CULTURE: NO GROWTH

## 2016-12-02 LAB — ADULT ROUTINE BLOOD CULTURE, SET OF 2 BOTTLES (BACTERIA AND YEAST): BLOOD CULTURE, ROUTINE: ABNORMAL — CR

## 2016-12-02 LAB — BASIC METABOLIC PANEL
ANION GAP: 6 mmol/L (ref 4–13)
BUN/CREA RATIO: 19 (ref 6–22)
BUN: 22 mg/dL (ref 8–25)
CALCIUM: 8.4 mg/dL — ABNORMAL LOW (ref 8.5–10.2)
CHLORIDE: 110 mmol/L (ref 96–111)
CO2 TOTAL: 19 mmol/L — ABNORMAL LOW (ref 22–32)
CREATININE: 1.17 mg/dL (ref 0.62–1.27)
ESTIMATED GFR: >59 mL/min/1.73mˆ2 (ref >59)
GLUCOSE: 107 mg/dL (ref 65–139)
POTASSIUM: 4.1 mmol/L (ref 3.5–5.1)
SODIUM: 135 mmol/L — ABNORMAL LOW (ref 136–145)

## 2016-12-02 LAB — MANUAL DIFFERENTIAL (CELLAVISION)
BANDS: 2 % (ref 0–15)
BASOPHIL #: 0 10*3/uL (ref 0.00–0.20)
BASOPHIL %: 0 %
EOSINOPHIL #: 0.24 10*3/uL (ref 0.00–0.50)
EOSINOPHIL %: 3 %
LYMPHOCYTE #: 1.62 10*3/uL (ref 1.00–4.80)
LYMPHOCYTE %: 20 %
MONOCYTE #: 0.24 10*3/uL — ABNORMAL LOW (ref 0.30–1.00)
MONOCYTE %: 3 %
MYELOCYTES: 1 % — ABNORMAL HIGH (ref 0–0)
NEUTROPHIL #: 5.91 10*3/uL (ref 1.50–7.70)
NEUTROPHIL %: 71 %

## 2016-12-02 LAB — H & H
HCT: 24.7 % — ABNORMAL LOW (ref 36.7–47.0)
HGB: 7.8 g/dL — ABNORMAL LOW (ref 12.5–16.3)

## 2016-12-02 LAB — CBC WITH DIFF
HCT: 21.7 % — ABNORMAL LOW (ref 36.7–47.0)
HGB: 6.8 g/dL — CL (ref 12.5–16.3)
MCH: 25.4 pg — ABNORMAL LOW (ref 27.4–33.0)
MCHC: 31.3 g/dL — ABNORMAL LOW (ref 32.5–35.8)
MCV: 81.3 fL (ref 78.0–100.0)
MPV: 6.5 fL — ABNORMAL LOW (ref 7.5–11.5)
PLATELETS: 373 x10?3/uL (ref 140–450)
PLATELETS: 373 x10ˆ3/uL (ref 140–450)
RBC: 2.66 x10ˆ6/uL — ABNORMAL LOW (ref 4.06–5.63)
RDW: 18.8 % — ABNORMAL HIGH (ref 12.0–15.0)
WBC: 8.1 x10ˆ3/uL (ref 3.5–11.0)

## 2016-12-02 LAB — MAGNESIUM: MAGNESIUM: 1.7 mg/dL (ref 1.6–2.5)

## 2016-12-02 LAB — PHOSPHORUS: PHOSPHORUS: 3.4 mg/dL (ref 2.4–4.7)

## 2016-12-02 MED ORDER — ALTEPLASE 2 MG/2 ML SYRINGE
2.0000 mg | INJECTION | Freq: Once | INTRAMUSCULAR | Status: AC
Start: 2016-12-03 — End: 2016-12-03
  Administered 2016-12-03: 2 mg
  Filled 2016-12-02: qty 2

## 2016-12-02 MED ORDER — SODIUM CHLORIDE 0.9 % IV BOLUS
40.0000 mL | INJECTION | Freq: Once | Status: AC | PRN
Start: 2016-12-02 — End: 2016-12-02

## 2016-12-02 MED ADMIN — sodium chloride 0.9 % (flush) injection syringe: ORAL | @ 08:00:00

## 2016-12-02 MED ADMIN — morphine 1 mg/mL in 0.9 % sodium chloride intravenous: ORAL | @ 06:00:00 | NDC 09999930851

## 2016-12-02 MED ADMIN — HEPARIN 6000 UNITS IN LR 3000 ML IRRIGATION - OSM: INTRAVENOUS | @ 14:00:00 | NDC 25021040067

## 2016-12-02 MED ADMIN — sodium chloride 0.9 % (flush) injection syringe: TOPICAL | @ 22:00:00

## 2016-12-02 MED ADMIN — ipratropium 0.5 mg-albuteroL 3 mg (2.5 mg base)/3 mL nebulization soln: INTRAVENOUS | @ 08:00:00

## 2016-12-02 MED ADMIN — mupirocin 2 % topical ointment: TOPICAL | @ 22:00:00 | NDC 00168035222

## 2016-12-02 MED ADMIN — HYDROmorphone (PF) 30 mg/30 mL (1 mg/mL) in 0.9 % NaCl IV PCA syringe: ORAL | @ 22:00:00

## 2016-12-02 NOTE — Nurses Notes (Signed)
Results for [name], [name] (MRN [id number]) as of 12/02/2016 04:20   Ref. Range 12/02/2016 03:31   HGB Latest Ref Range: 12.5 - 16.3 g/dL 6.8 (LL)     MH 1, [name], notified of patient hmgb. Will continue to monitor.

## 2016-12-02 NOTE — Care Plan (Signed)
Problem: Infection, Risk/Actual (Adult)  Goal: Infection Prevention/Resolution  Patient will demonstrate the desired outcomes by discharge/transition of care.   Outcome: Ongoing (see interventions/notes)  Patient encouraged to not remove dressings from feet    Problem: Mobility, Physical Impaired (Adult)  Goal: Enhanced Mobility Skills  Patient will demonstrate the desired outcomes by discharge/transition of care.   Outcome: Ongoing (see interventions/notes)    Goal: Enhanced Functional Ability  Patient will demonstrate the desired outcomes by discharge/transition of care.   Outcome: Ongoing (see interventions/notes)      Problem: Health Knowledge, Opportunity to Enhance (Adult,Obstetrics,Pediatric)  Goal: Knowledgeable about Health Subject/Topic  Patient will demonstrate the desired outcomes by discharge/transition of care.   Outcome: Ongoing (see interventions/notes)      Problem: Self-Care Deficit (Adult,Obstetrics,Pediatric)  Goal: Improved Ability to Perform BADL and IADL  Patient will demonstrate the desired outcomes by discharge/transition of care.   Outcome: Ongoing (see interventions/notes)      Problem: Surgery Nonspecified (Adult)  Prevent and manage potential problems including:  1. bleeding/anemia  2. bowel motility decreased  3. infection  4. pain  5. postoperative nausea and vomiting  6. postoperative urinary retention  7. respiratory compromise  8. situational response  9. VTE (venous thromboembolism)  10. wound healing impaired   Goal: Signs and Symptoms of Listed Potential Problems Will be Absent, Minimized or Managed (Surgery Nonspecified)  Signs and symptoms of listed potential problems will be absent, minimized or managed by discharge/transition of care (reference Surgery Nonspecified (Adult) CPG).   Outcome: Ongoing (see interventions/notes)    Goal: Anesthesia/Sedation Recovery  Outcome: Ongoing (see interventions/notes)      Problem: Wound (Includes Pressure Injury) (Adult)  Prevent and  manage potential problems including:  1. bleeding  2. delayed wound healing  3. infection  4. pain  5. situational response  6. skin injury, new onset   Goal: Signs and Symptoms of Listed Potential Problems Will be Absent, Minimized or Managed (Wound)  Signs and symptoms of listed potential problems will be absent, minimized or managed by discharge/transition of care (reference Wound (Includes Pressure Injury) (Adult) CPG).   Outcome: Ongoing (see interventions/notes)      Problem: Depression (Adult,Obstetrics,Pediatric)  Goal: Establish/Maintain Self-Care Routine  Patient will demonstrate the desired outcomes by discharge/transition of care.   Outcome: Ongoing (see interventions/notes)    Goal: Improved/Stable Mood  Patient will demonstrate the desired outcomes by discharge/transition of care.   Outcome: Ongoing (see interventions/notes)      Problem: Skin Injury Risk (Adult,Obstetrics,Pediatric)  Goal: Skin Health and Integrity  Patient will demonstrate the desired outcomes by discharge/transition of care.   Outcome: Ongoing (see interventions/notes)      Problem: Behavioral Regulation Impairment (Disruptive Behavior) (Adult)  Goal: Improved Impulse/Aggression Control (Disruptive Behavior)  Outcome: Ongoing (see interventions/notes)      Problem: Social/Occupational/Functional Impairment (Disruptive Behavior) (Adult)  Goal: Improved Social/Occupational/Functional Skills (Disruptive Behavior)  Outcome: Ongoing (see interventions/notes)      Problem: Patient Care Overview (Adult,OB)  Goal: Individualization/Patient Specific Goal(Adult/OB)  Outcome: Ongoing (see interventions/notes)    Goal: Interdisciplinary Rounds/Family Conf  Outcome: Ongoing (see interventions/notes)      Problem: Fall Risk (Adult)  Goal: Identify Related Risk Factors and Signs and Symptoms  Related risk factors and signs and symptoms are identified upon initiation of Human Response Clinical Practice Guideline (CPG).   Outcome: Ongoing (see  interventions/notes)    Goal: Absence of Falls  Patient will demonstrate the desired outcomes by discharge/transition of care.   Outcome: Ongoing (see interventions/notes)

## 2016-12-02 NOTE — Nurses Notes (Addendum)
Reported by previous nurse of inappropriate behavior over night.  Per off going nurse patient requesting for nurse to show him her feet that he has a fettish with feet and that he wanted to suck on her toes.  Per Nurse she told patient she was going to put him on a bed alarm and patient refused and then told him she would have to put him on video monitoring, per the nurse patient said "I dont think that will be a good idea I JACK OFF A LOT".  Service at the bedside and addressed inappropriate behavior with male staff.

## 2016-12-02 NOTE — Wound Therapy (Signed)
[facility]  Ostomy Assessment and Education    Name: [name]  Date of Birth: [birth year]  MRN: [id number]  Date: 12/02/2016  Time: 11:39  Admission Date: 11/29/2016 10:31 PM  Admission Diagnosis: Decubitus ulcer [L89.90]    Past Medical History:   Diagnosis Date    Hepatitis C     IVDU (intravenous drug user)     Paraplegia (CMS HCC)     Sacral decubitus ulcer          Past Surgical History:   Procedure Laterality Date    HIP DEBRIDEMENT      HX COLOSTOMY             Vitals:   Filed Vitals:    12/02/16 0649 12/02/16 0654 12/02/16 0709 12/02/16 0725   BP: (!) 118/40 (!) 101/47 (!) 105/46 (!) 105/52   Pulse: (!) 104 (!) 101 (!) 105 (!) 102   Resp: Temp: 37.1 C (98.7 F)   36.7 C (98.1 F)   SpO2: 100%  100% 98%       Current Facility-Administered Medications   Medication Dose Route Frequency    acetaminophen (TYLENOL) tablet  650 mg Oral Q6HRS    ascorbic acid (VITAMIN C) tablet  500 mg Oral 2x/day    bacitracin zinc 500 units/gram topical ointment packet  1 Packet Topical 2x/day    baclofen (LIORESAL) tablet  20 mg Oral 3x/day    ceFEPime (MAXIPIME) 2 g in iso-osmotic 100 mL premix IVPB  2 g Intravenous Q8H    DAPTOmycin (CUBICIN) 700 mg in NS 100 mL IVPB  8 mg/kg Intravenous Q24H    escitalopram (LEXAPRO) tablet  20 mg Oral Daily    furosemide (LASIX) tablet  20 mg Oral QAM    furosemide (LASIX) tablet  10 mg Oral QPM    influenza virus vaccine (PF) IM injection (FLUARIX for ages 43 months and older)  0.5 mL Intramuscular Once    ketorolac (TORADOL) 30 mg/mL injection  30 mg Intravenous Q8HRS    lanolin-oxyquin-pet, hydrophil (BAG BALM) topical ointment   Apply Topically 4x/day    NS bolus infusion 40 mL  40 mL Intravenous Once PRN    NS flush syringe  2 mL Intracatheter Q8HRS    And    NS flush syringe  2-6 mL Intracatheter Q1 MIN PRN    NS premix infusion   Intravenous Continuous    oxyCODONE (ROXICODONE) immediate release tablet  15 mg Oral Q4H PRN    Or     oxyCODONE (ROXICODONE) immediate release tablet  20 mg Oral Q4H PRN    pantoprazole (PROTONIX) delayed release tablet  40 mg Oral Daily    perflutren lipid microspheres (DEFINITY) 1.1 mg/mL injection  0.3 mL Intravenous Give in Cardiology    pregabalin (LYRICA) capsule 150 mg  150 mg Oral 2x/day    sennosides-docusate sodium (SENOKOT-S) 8.6-50mg  per tablet  1 Tab Oral 2x/day    sodium hypochlorite (DAKINS) 0.125% (quarter strength) irrigation   Irrigation Q12H    sodium hypochlorite 0.0125% (WOUNDCLENZ OTC) irrigation   Irrigation Q12H    zinc sulfate (ZINCATE) 220 mg (50 mg elemental zinc) tablet  220 mg Oral Daily     No current outpatient prescriptions on file.       ALBUMIN   Date/Time Value Ref Range Status   11/29/2016 11:24 PM 1.6 (L) 3.5 - 5.0 g/dL Final     HGB   Date/Time Value Ref Range Status  12/02/2016 03:31 AM 6.8 (LL) 12.5 - 16.3 g/dL Final     HEMOGLOBIN X5M   Date/Time Value Ref Range Status   11/29/2016 11:25 PM 4.6 4.0 - 6.0 % Final       Date 12/01/16 0700 - 12/02/16 0659 12/02/16 0700 - 12/03/16 0659   Shift 0700-1459 1500-2259 2300-0659 24 Hour Total 0700-1459 1500-2259 2300-0659 24 Hour Total   I  N  T  A  K  E   P.O.  720  720 240   240      Oral  720  720 240   240    I.V.  (mL/kg/hr) 300  (0.41) 610  (0.84) 813.9  (1.12) 1723.9  (0.79)          IVPB Volume   213.9 213.9          Med (IV) Flush Volume  10  10          Volume (NS premix infusion) 300 [phone]        Blood     351   351      Volume (mL) (NURSING--TRANSFUSE RED BLOOD CELLS 1 Unit)     351   351    Shift Total  (mL/kg) 300  (3.31) 1330  (14.7) 813.9  (8.99) 2443.9  (27) 591  (6.53)   591  (6.53)   O  U  T  P  U  T   Urine  (mL/kg/hr)  600  (0.83) 450  (0.62) 1050  (0.48)          Output (Foley Catheter)  [phone]        Stool              Stool Occurrence  1 x  1 x        Shift Total  (mL/kg)  600  (6.63) 450  (4.97) 1050  (11.6)       Weight (kg) 90.5 90.5 90.5 90.5 90.5 90.5 90.5 90.5     09/24 0000 - 09/24  2359  In: 2230 [P.O.:720; I.V.:1510]  Out: 1800 [Urine:1800]    Date of Surgery: July 2018    Current Problem: education and ostomy assessment    Stoma #1  Current Ostomy Pouch:2 3/4 two piece  Current Ostomy Pouch:Transparent, Drainable, Comfort Panel Backing , Lock and Roll, Filter  Current Ostomy Barrier:Flat, Cut to [employer]: Powder, No Sting Skin Prep, Adhesive remover wipe  Stoma Type: Colostomy  Protrusion: Budded  Stoma Size: 1 1/4 x 1 1/2"  Stoma Shape: Oval  Location of Ostomy:left lower  Os Location: Slanted  Mucosa Color: NML  Stoma Cx's: Mucocutaneous suture line separation: none noted  Peristomal Skin: Moist, Inflamed, peeling, irritated  Pain: patient denies  Output: loose brown stool  Patient provides independent self-care: YES          Educational Info:  [facility] understanding your Colostomy. [facility] Living with an ostomy: Healthy Eating. [facility] Routine Care of your ostomy. [place] One Piece and Two Piece Pouching Instructions. Basic ostomy care handout. Provided at patient bedside for review.    Ostomy Summary/Comment: WOCN Team received consult to see patient after best practice completed on Sunday. Per bedside nurse, patient reported stoma being edematous with last appliance change. Patient was extremely difficult to arouse and slept through majority of visit today. Bedside nurse and ostomy nurse able to position patient for ostomy assessment. Per bedside nurse, stoma is no longer swollen like it was yesterday.  Changed ostomy appliance at this time with assessment above. Took down old appliance utilizing adhesive remover wipes. Cleaned stoma and peristomal skin well with soap and water. Dried completely. Peristomal skin irritated and easily bleeds. Applied stoma powder and performed crusting technique utilizing no sting skin prep wipes. Applied cut to fit ostomy barrier to stoma. Applied 2 minutes of light manual pressure to barrier to ensure good seal. Applied pouch to barrier.        Provided patient with educational material but patient would not arouse for education at this time. YES further education is needed prior to discharge. Plan will be to change appliance with patient (family) if patient remains hospitalized, should patient be discharged additional education will be provided by home health. Patient provided with outpatient ostomy nurse and [facility] Nurse contact information should additional questions arise after discharge.     Bedside nursing please continue to reinforce education and allow patient to assist with emptying and changing appliance as needed.     Recommendations:   -Albumin Levels (One Time)  -Most recent Albumin level 1.6  -Change pouching system every 2-3 days, PRN  -Aggressive preventive measures, utilize PUP bundle  -Head of Bed less than 30 degrees when patient can tolerate to decrease friction and shear to coccyx area  -Care Management for SNF Placement, Home Health  -Follow up with outpatient ostomy clinic    Prescriptions for ostomy supplies will be placed under DME order by Care Management Prior to Discharge, please also work with patients preferred medical supply provider. Order placed in [facility] for outpatient ostomy nurse visit once patient is discharged to coordinate with return patient visit.     Supplies Needed upon discharge for ostomy care:  [employer] 2 3/4 two piece barrier #[id number]  [employer] 2 3/4 two piece drainable pouch [phone]    Accessories:  Administrator, arts Stoma Powder # [id number] Remover Wipe [phone]  Sting Free Barrier Skin Wipe # [id number]    Interactions:     Spoke with bedside nurse, relayed ostomy findings and recommendations. Bedside nurse verbalized understanding, no additional needs at this time, please feel free to contact ostomy care should additional needs arise.     Will continue to follow patient during hospitalization.    Patient provided with supplies for ostomy care including (2) 2 3/4 barriers, (2) 2 3/4  pouches, stoma powder, paste, no sting skin prep and adhesive remover wipes located at patient bedside.     Hayes Ludwig, RN  WOCN Team  [phone]
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
## 2016-12-02 NOTE — Nurses Notes (Signed)
Assessment per flow sheet, vitals stable, patient encouraged to use call light, will continue to monitor

## 2016-12-02 NOTE — Nurses Notes (Signed)
Patient removed dressing from bilateral heels, stated he didn't need them

## 2016-12-02 NOTE — Nurses Notes (Signed)
Blood transfusion finished, no blood reaction noted, vitals stable, will continue to monitor

## 2016-12-02 NOTE — Progress Notes (Signed)
[facility]  Medicine Progress Note  Full Code    [name]  Date of service: 12/02/2016    Subjective:  Is very worried and scared about having an infection in his heart.  Hemoglobin dropped this morning to 6.8.  He was transfused 1 unit packed red blood cells.  The patient states that over the past few days status did dark stools out of his ostomy.  He also states that he has had blood from his sacral ulcers.  He is still waiting for his ECHO.  He denies any fevers or chills.  Endorses back pain.    Vital Signs:  Temp (24hrs) Max:37.1 C (98.7 F)      Systolic (24hrs), Avg:122 , Min:107 , Max:145     Diastolic (24hrs), Avg:75, Min:40, Max:112    Temp  Avg: 36.8 C (98.3 F)  Min: 36.5 C (97.7 F)  Max: 37.1 C (98.7 F)  Pulse  Avg: 108.6  Min: 104  Max: 112  Resp  Avg: 16.9  Min: 12  Max: 20  SpO2  Avg: 94.3 %  Min: 89 %  Max: 99 %  MAP (Non-Invasive)  Avg: 89 mmHG  Min: 64 mmHG  Max: 124 mmHG  Pain Score (Numeric, Faces): 10  Fi02    I/O:  I/O last 24 hours:      Intake/Output Summary (Last 24 hours) at 12/02/16 0656  Last data filed at 12/02/16 0600   Gross per 24 hour   Intake           2368.9 ml   Output             1050 ml   Net           1318.9 ml     I/O current shift:  09/25 0000 - 09/25 0759  In: 663.9 [I.V.:663.9]  Out: 450 [Urine:450]  Blood Sugars: Last Fingerstick:  No results found for: GLUCOSEPOC      Current Facility-Administered Medications:  acetaminophen (TYLENOL) tablet 650 mg Oral Q6HRS   ascorbic acid (VITAMIN C) tablet 500 mg Oral 2x/day   bacitracin zinc 500 units/gram topical ointment packet 1 Packet Topical 2x/day   baclofen (LIORESAL) tablet 20 mg Oral 3x/day   ceFEPime (MAXIPIME) 2 g in iso-osmotic 100 mL premix IVPB 2 g Intravenous Q8H   DAPTOmycin (CUBICIN) 700 mg in NS 100 mL IVPB 8 mg/kg Intravenous Q24H   enoxaparin PF (LOVENOX) 40 mg/0.4 mL SubQ injection 40 mg Subcutaneous Q24H   escitalopram (LEXAPRO) tablet 20 mg Oral Daily   furosemide (LASIX) tablet 20 mg Oral  QAM   furosemide (LASIX) tablet 10 mg Oral QPM   influenza virus vaccine (PF) IM injection (FLUARIX for ages 15 months and older) 0.5 mL Intramuscular Once   ketorolac (TORADOL) 30 mg/mL injection 30 mg Intravenous Q8HRS   lanolin-oxyquin-pet, hydrophil (BAG BALM) topical ointment  Apply Topically 4x/day   morphine 4 mg/mL injection 4 mg Intravenous Q6H PRN   NS bolus infusion 40 mL 40 mL Intravenous Once PRN   NS flush syringe 2 mL Intracatheter Q8HRS   And      NS flush syringe 2-6 mL Intracatheter Q1 MIN PRN   NS premix infusion  Intravenous Continuous   oxyCODONE (ROXICODONE) immediate release tablet 15 mg Oral Q4H PRN   Or      oxyCODONE (ROXICODONE) immediate release tablet 20 mg Oral Q4H PRN   pantoprazole (PROTONIX) delayed release tablet 40 mg Oral Daily   perflutren lipid microspheres (DEFINITY)  1.1 mg/mL injection 0.3 mL Intravenous Give in Cardiology   pregabalin (LYRICA) capsule 150 mg 150 mg Oral 2x/day   sennosides-docusate sodium (SENOKOT-S) 8.6-50mg  per tablet 1 Tab Oral 2x/day   sodium hypochlorite (DAKINS) 0.125% (quarter strength) irrigation  Irrigation Q12H   sodium hypochlorite 0.0125% (WOUNDCLENZ OTC) irrigation  Irrigation Q12H   zinc sulfate (ZINCATE) 220 mg (50 mg elemental zinc) tablet 220 mg Oral Daily       No Known Allergies    Physical Exam:  General: Appears chronically ill.  No acute distress.  Head: Normocephalic and atraumatic.  No obvious abnormality.  Eyes: Conjunctiva clear.  Pupils equal, round.  Extraocular eye movement intact.    ENT: Mouth mucous membranes moist. Pharynx without injection or exudate.  Trachea midline  Respiratory: Breathing non-labored. Clear to auscultation bilateral. No wheezes, rhonchi, or rales.  Cardiovascular: Tachycardic. Regular rhythm Normal S1/S2.   Gastrointestinal: Ostomy present. Soft, non-tender.  Bowel sounds normal. No hepatomegaly.  Extremities: 4+ dorsalis pedis, posterior tibial, femoral, and popliteal pulses bilateral.  No cyanosis or  clubbing.  Skin: Bilateral stage IV sacral decubitus ulcers to bilateral hips. Stage IV coccyx ulcer.    Neurologic: A/O x3.  Paraplegia lower extremities.    Psychiatric: Fluent in speech and cognition.    Labs:  CBC   Recent Labs      11/29/16   2325  12/01/16   0927  12/02/16   0331   WBC  15.1*  10.2  8.1   HGB  8.0*  7.5*  6.8*   HCT  26.2*  24.2*  21.7*   PLTCNT  424  406  373   BANDS   --    --   2        BMP   Recent Labs      11/29/16   2324  12/01/16   0927  12/02/16   0331   SODIUM  133*  134*  135*   POTASSIUM  3.8  3.7  4.1   CHLORIDE  100  106  110   CO2  26  22  19*   BUN  CREATININE  0.88  1.01  1.17   GLUCOSENF  94  70  107   ANIONGAP  BUNCRRATIO  GFR  >59  >59  >59         Radiology:    Hip XR: Multifocal pelvic osteomyelitis with large bilateral decubitus ulcers.    Microbiology:   Blood cultures ordered but not yet collected. MRSA from blood cultures on 9/22 and GPC from 9/23.    PT/OT: Yes    Consults:  Plastic surgery    Hardware (lines, foley's, tubes):  Peripheral IV lines    Assessment/ Plan:   Active Hospital Problems    Diagnosis   . Primary Problem: Sepsis (CMS HCC)   . Hepatitis C antibody positive in blood   . Drug-seeking behavior   . Decubitus ulcer      36 year old male with a history of extensive decubitus ulcers, presented with sepsis and infected decubitus ulcers    MRSA Sepsis  Osteomyelitis and sacral decubitus stage 4 ulcers, bilateral  Tachycardia, Elevated White count, Fever on presentation  Daptomycin and Cefepime continued  Plastic surgery team consulted, patient is not a surgical candidate for a flap procedure, no surgical intervention planned by Plastic surgery   Will consider Orthopedics  consult today.  Patient is s/p ileostomy for decubitus ulcers  CRP, Procalcitonin elevated  NS at 75/hr  PRN Roxicodone for pain control (15 mg Q4H prn and 20 mg Q4H prn)  Bowel regimen ordered with Miralax and Senokot  Decubitus ulcer precautions,  frequent repositioning  Blood cultures positive for MRSA; repeat blood cultures pending  TTE pending    Anemia, acute blood loss  - Acute blood loss anemia secondary to decubitus ulcers on sacrum vs GI bleed  - protonix 40 mg increased to twice daily  - received 1 unit pRBC this AM. Recheck hemoglobin pending.  - Transfuse as needed below 7  - discontinue lovenox  - monitor output from ostomy    Drug-seeking behavior   Patient is preoccupied with IV pain medications, and I am concerned about addiction potential in this patient   Counseled patient extensively regarding pain medications   Pain management team consulted  Will manage pain with oral opiates    Status post ileostomy  Ileostomy site healthy  Ostomy consult generated  Patient education ordered    Severe protein- calorie malnutrition  Albumin 1.6 on presentation  Pre albumin ordered  Likely contributing to poor wound healing  MNT protocol and nutrition consult    Hepatitis C   Hep C screen positive  HIV screen ordered, pending  Patient reports that he has not been treated for hepatitis-C in the past  Will establish with infectious disease clinic on discharge    DVT/PE Prophylaxis: Discontinued lovenox due to low hemoglobin.    Disposition Planning: To be decided, pending PT/OT     Ridwaan Albeiruti, MD 12/02/2016 06:56  [facility] of Medicine  Internal Medicine, PGY-3  Pager [phone]      I saw and examined the patient.  I reviewed the resident's note.  I agree with the findings and plan of care as documented in the resident's note.  Any exceptions/additions are edited/noted.    Royden Purl Carducci, DO

## 2016-12-02 NOTE — Care Plan (Signed)
Problem: Infection, Risk/Actual (Adult)  Goal: Infection Prevention/Resolution  Patient will demonstrate the desired outcomes by discharge/transition of care.   Outcome: Ongoing (see interventions/notes)      Problem: Mobility, Physical Impaired (Adult)  Goal: Enhanced Mobility Skills  Patient will demonstrate the desired outcomes by discharge/transition of care.   Outcome: Ongoing (see interventions/notes)    Goal: Enhanced Functional Ability  Patient will demonstrate the desired outcomes by discharge/transition of care.   Outcome: Ongoing (see interventions/notes)      Problem: Health Knowledge, Opportunity to Enhance (Adult,Obstetrics,Pediatric)  Goal: Knowledgeable about Health Subject/Topic  Patient will demonstrate the desired outcomes by discharge/transition of care.   Outcome: Ongoing (see interventions/notes)      Problem: Self-Care Deficit (Adult,Obstetrics,Pediatric)  Goal: Improved Ability to Perform BADL and IADL  Patient will demonstrate the desired outcomes by discharge/transition of care.   Outcome: Ongoing (see interventions/notes)      Problem: Wound (Includes Pressure Injury) (Adult)  Prevent and manage potential problems including:  1. bleeding  2. delayed wound healing  3. infection  4. pain  5. situational response  6. skin injury, new onset   Goal: Signs and Symptoms of Listed Potential Problems Will be Absent, Minimized or Managed (Wound)  Signs and symptoms of listed potential problems will be absent, minimized or managed by discharge/transition of care (reference Wound (Includes Pressure Injury) (Adult) CPG).   Outcome: Ongoing (see interventions/notes)      Problem: Depression (Adult,Obstetrics,Pediatric)  Goal: Establish/Maintain Self-Care Routine  Patient will demonstrate the desired outcomes by discharge/transition of care.   Outcome: Ongoing (see interventions/notes)      Problem: Skin Injury Risk (Adult,Obstetrics,Pediatric)  Goal: Skin Health and Integrity  Patient will demonstrate  the desired outcomes by discharge/transition of care.   Outcome: Ongoing (see interventions/notes)      Problem: Patient Care Overview (Adult,OB)  Goal: Plan of Care Review(Adult,OB)  The patient and/or their representative will communicate an understanding of their plan of care   Outcome: Ongoing (see interventions/notes)  Plan of care reviewed with patient. Alert, oriented and agreeable to care. Refuses BA at this time, agreeable to VM, currently on wait list. Patient utilizes trapeze to move around in bed. Dressings changed as needed. PRN pain medication for patient discomfort. Anticoagulants given to prevent blood clots. Mepilex border dressings utilized and bony prominences protected. Foley patent and draining clear, amber urine. PICC line in LUA, normal saline running at 75 ml/hr. Colostomy drained by patient. Call bell within reach. Hourly rounding for needs. Will continue to monitor IO's, labs, and VS.  Goal: Individualization/Patient Specific Goal(Adult/OB)  Outcome: Ongoing (see interventions/notes)

## 2016-12-02 NOTE — Nurses Notes (Signed)
Dressing changed to bilateral decubitus ulcer sites per orders.

## 2016-12-02 NOTE — Nurses Notes (Addendum)
Blood transfusion started, 1 unit to be infused. Transfusion education providedd to patient, verbalized understanding. Blood consent obtained. Will continue to monitor patient.

## 2016-12-02 NOTE — Nurses Notes (Signed)
Patient off the floor for TEE

## 2016-12-02 NOTE — Nurses Notes (Addendum)
12/02/16 1354   Vital Signs   Temperature 36.6 C (97.9 F)   Temp Source Oral   Heart Rate 88   Respiratory Rate 12   BP (Non-Invasive) (!) 142/77   MAP (Non-Invasive) 94 mmHG   BP Source (Non-Invasive) C;Manual;RA   Patient Position Sitting        12/02/16 1354   Oxygen Therapy   SpO2-1 95 %   HR-SpO2 86 bpm   $ O2 Delivery RA

## 2016-12-02 NOTE — Nurses Notes (Signed)
Patient refuses bed alarm at this time. Video monitoring called and patient put on wait list for VM. Will continue to monitor and  Put patient on VM at earliest opening.

## 2016-12-02 NOTE — Nurses Notes (Addendum)
MH1, Jonah Womack re-paged regarding patient [name], 6.8. Transfusion orders placed. Patient consented for blood. Will continue to monitor.

## 2016-12-02 NOTE — Nurses Notes (Signed)
Results for JAESHAUN, RIVA (MRN [id number]) as of 12/02/2016 16:00   Ref. Range 12/02/2016 15:07   HGB Latest Ref Range: 12.5 - 16.3 g/dL 7.8 (L)   HCT Latest Ref Range: 36.7 - 47.0 % 24.7 (L)

## 2016-12-03 ENCOUNTER — Inpatient Hospital Stay (HOSPITAL_BASED_OUTPATIENT_CLINIC_OR_DEPARTMENT_OTHER): Payer: Medicare Other

## 2016-12-03 DIAGNOSIS — I38 Endocarditis, valve unspecified: Secondary | ICD-10-CM

## 2016-12-03 DIAGNOSIS — D6489 Other specified anemias: Secondary | ICD-10-CM

## 2016-12-03 LAB — PROCALCITONIN: PROCALCITONIN: 0.87 ng/mL — ABNORMAL HIGH (ref <0.50)

## 2016-12-03 LAB — CBC WITH DIFF
BASOPHIL #: 0.07 10*3/uL (ref 0.00–0.20)
BASOPHIL %: 1 %
EOSINOPHIL #: 0.15 10*3/uL (ref 0.00–0.50)
EOSINOPHIL %: 2 %
HCT: 21.9 % — ABNORMAL LOW (ref 36.7–47.0)
HGB: 7.1 g/dL — ABNORMAL LOW (ref 12.5–16.3)
LYMPHOCYTE #: 1.1 10*3/uL (ref 1.00–4.80)
LYMPHOCYTE %: 17 %
MCH: 25.8 pg — ABNORMAL LOW (ref 27.4–33.0)
MCHC: 32.2 g/dL — ABNORMAL LOW (ref 32.5–35.8)
MCV: 79.9 fL (ref 78.0–100.0)
MONOCYTE #: 0.66 10*3/uL (ref 0.30–1.00)
MONOCYTE %: 10 %
MPV: 6.1 fL — ABNORMAL LOW (ref 7.5–11.5)
NEUTROPHIL #: 4.51 10*3/uL (ref 1.50–7.70)
NEUTROPHIL %: 70 %
PLATELETS: 389 10*3/uL (ref 140–450)
RBC: 2.74 10*6/uL — ABNORMAL LOW (ref 4.06–5.63)
RDW: 19 % — ABNORMAL HIGH (ref 12.0–15.0)
WBC: 6.5 10*3/uL (ref 3.5–11.0)

## 2016-12-03 LAB — BPAM PACKED CELL ORDER: UNIT DIVISION: 0

## 2016-12-03 LAB — TYPE AND CROSS RED CELLS - UNITS
ABO/RH(D): O POS
ANTIBODY SCREEN: NEGATIVE
UNITS ORDERED: 1

## 2016-12-03 LAB — H & H
HCT: 24 % — ABNORMAL LOW (ref 36.7–47.0)
HGB: 7.5 g/dL — ABNORMAL LOW (ref 12.5–16.3)

## 2016-12-03 LAB — BASIC METABOLIC PANEL
ANION GAP: 6 mmol/L (ref 4–13)
BUN/CREA RATIO: 18 (ref 6–22)
BUN: 13 mg/dL (ref 8–25)
CALCIUM: 6.9 mg/dL — ABNORMAL LOW (ref 8.5–10.2)
CHLORIDE: 120 mmol/L — ABNORMAL HIGH (ref 96–111)
CO2 TOTAL: 17 mmol/L — ABNORMAL LOW (ref 22–32)
CREATININE: 0.71 mg/dL (ref 0.62–1.27)
ESTIMATED GFR: >59 mL/min/{1.73_m2} (ref >59)
GLUCOSE: 63 mg/dL — ABNORMAL LOW (ref 65–139)
POTASSIUM: 3.5 mmol/L (ref 3.5–5.1)
SODIUM: 143 mmol/L (ref 136–145)

## 2016-12-03 LAB — PHOSPHORUS: PHOSPHORUS: 3.4 mg/dL (ref 2.4–4.7)

## 2016-12-03 LAB — CREATINE KINASE (CK), TOTAL, SERUM OR PLASMA: CREATINE KINASE: 46 U/L (ref 45–225)

## 2016-12-03 LAB — C-REACTIVE PROTEIN(CRP),INFLAMMATION: CRP INFLAMMATION: 165.9 mg/L — ABNORMAL HIGH (ref <=8.0)

## 2016-12-03 LAB — HEPATITIS C VIRUS (HCV) RNA DETECTION AND QUANTIFICATION, PCR, PLASMA: HCV QUANTITATIVE PCR: NOT DETECTED

## 2016-12-03 LAB — MAGNESIUM: MAGNESIUM: 1.5 mg/dL — ABNORMAL LOW (ref 1.6–2.5)

## 2016-12-03 MED ORDER — METRONIDAZOLE 250 MG TABLET
500.0000 mg | ORAL_TABLET | Freq: Three times a day (TID) | ORAL | Status: DC
Start: 2016-12-03 — End: 2016-12-08
  Administered 2016-12-03 – 2016-12-08 (×16): 500 mg via ORAL
  Filled 2016-12-03 (×19): qty 2

## 2016-12-03 MED ORDER — GLYCOPYRROLATE 0.2 MG/ML INJECTION SOLUTION
Freq: Once | INTRAMUSCULAR | Status: AC | PRN
Start: 2016-12-03 — End: 2016-12-03
  Administered 2016-12-03: 200 ug via INTRAVENOUS

## 2016-12-03 MED ORDER — GLYCOPYRROLATE 0.2 MG/ML INJECTION SOLUTION
INTRAMUSCULAR | Status: AC
Start: 2016-12-03 — End: 2016-12-03
  Filled 2016-12-03: qty 1

## 2016-12-03 MED ORDER — MIDAZOLAM 1 MG/ML INJECTION SOLUTION
INTRAMUSCULAR | Status: AC
Start: 2016-12-03 — End: 2016-12-03
  Filled 2016-12-03: qty 6

## 2016-12-03 MED ORDER — DAPTOMYCIN 500 MG INTRAVENOUS SOLUTION
6.00 mg/kg | INTRAVENOUS | Status: DC
Start: 2016-12-04 — End: 2016-12-08
  Administered 2016-12-04: 550 mg via INTRAVENOUS
  Administered 2016-12-04 – 2016-12-05 (×2): 0 mg via INTRAVENOUS
  Administered 2016-12-05: 550 mg via INTRAVENOUS
  Administered 2016-12-06: 0 mg via INTRAVENOUS
  Administered 2016-12-06 – 2016-12-07 (×2): 550 mg via INTRAVENOUS
  Administered 2016-12-07 – 2016-12-08 (×2): 0 mg via INTRAVENOUS
  Administered 2016-12-08: 550 mg via INTRAVENOUS
  Filled 2016-12-03 (×5): qty 11

## 2016-12-03 MED ORDER — LIDOCAINE HCL 2 % MUCOSAL SOLUTION
Freq: Once | Status: AC | PRN
Start: 2016-12-03 — End: 2016-12-03
  Administered 2016-12-03: 10 mL

## 2016-12-03 MED ORDER — FENTANYL (PF) 50 MCG/ML INJECTION SOLUTION
INTRAMUSCULAR | Status: AC
Start: 2016-12-03 — End: 2016-12-03
  Filled 2016-12-03: qty 2

## 2016-12-03 MED ORDER — SODIUM CHLORIDE 0.9 % IV BOLUS
INJECTION | Status: AC | PRN
Start: 2016-12-03 — End: 2016-12-03
  Administered 2016-12-03: 500 mL via INTRAVENOUS

## 2016-12-03 MED ORDER — MAGNESIUM SULFATE 4 GRAM/100 ML (4 %) IN WATER INTRAVENOUS PIGGYBACK
4.0000 g | INJECTION | INTRAVENOUS | Status: AC
Start: 2016-12-03 — End: 2016-12-03
  Administered 2016-12-03: 4 g via INTRAVENOUS
  Administered 2016-12-03: 0 g via INTRAVENOUS
  Filled 2016-12-03: qty 100

## 2016-12-03 MED ORDER — MIDAZOLAM 1 MG/ML INJECTION SOLUTION
INTRAMUSCULAR | Status: AC
Start: 2016-12-03 — End: 2016-12-03
  Filled 2016-12-03: qty 4

## 2016-12-03 MED ORDER — LIDOCAINE HCL 2 % MUCOSAL SOLUTION
Status: AC
Start: 2016-12-03 — End: 2016-12-03
  Filled 2016-12-03: qty 15

## 2016-12-03 MED ORDER — MIDAZOLAM 1 MG/ML INJECTION SOLUTION
Freq: Once | INTRAMUSCULAR | Status: AC | PRN
Start: 2016-12-03 — End: 2016-12-03
  Administered 2016-12-03 (×3): 2 mg via INTRAVENOUS

## 2016-12-03 MED ORDER — FENTANYL (PF) 50 MCG/ML INJECTION SOLUTION
Freq: Once | INTRAMUSCULAR | Status: AC | PRN
Start: 2016-12-03 — End: 2016-12-03
  Administered 2016-12-03: 25 ug via INTRAVENOUS

## 2016-12-03 MED ADMIN — baclofen 10 mg tablet: ORAL | @ 11:00:00

## 2016-12-03 MED ADMIN — oxyCODONE 5 mg tablet: @ 21:00:00

## 2016-12-03 MED ADMIN — cefepime 2 gram/100 mL in dextrose (iso-osmotic) intravenous piggyback: INTRAVENOUS | @ 20:00:00

## 2016-12-03 MED ADMIN — lanolin-oxyquin-pet, hydrophil topical ointment: TOPICAL | @ 14:00:00

## 2016-12-03 MED ADMIN — sodium chloride 0.9 % intravenous solution: INTRAVENOUS | @ 09:00:00 | NDC 00338004904

## 2016-12-03 MED ADMIN — SODIUM CHLORIDE 0.9 % W/ ADDITIVES: INTRAVENOUS | @ 09:00:00 | NDC 00338004904

## 2016-12-03 MED ADMIN — sodium chloride 0.9 % (flush) injection syringe: ORAL | @ 11:00:00

## 2016-12-03 MED ADMIN — glycerin-witch hazeL 12.5 %-50 % topical pads: INTRAVENOUS | @ 04:00:00 | NDC 50289325001

## 2016-12-03 MED ADMIN — nystatin 100,000 unit/gram topical powder: ORAL | @ 17:00:00 | NDC 00574200802

## 2016-12-03 MED ADMIN — sodium chloride 0.9 % (flush) injection syringe: INTRAVENOUS | @ 09:00:00

## 2016-12-03 MED ADMIN — nystatin 100,000 unit/gram topical powder: TOPICAL | @ 22:00:00 | NDC 00574200815

## 2016-12-03 NOTE — Sedation Documentation (Addendum)
12/03/16    Procedure:  TEE    Diagnosis: endocarditis    Procedure Time out          Prep Completed At: 0815         Sedation and Procedure Times:    Sedation Start Time:: 0915  Sedation End/Recovery Start Time: 0930               Aldrete Scores    Pre Sedation  Activity: 2-->able to move 4 extremities voluntarily or on command  Respiration: 2-->able to breathe and cough freely  Circulation: 2-->BP within 20% of pre-anesthetic level  Consciousness: 2-->fully awake  O2 Saturation: 2-->able to maintain O2 saturation greater than 92% on room air  Dressing: 2-->dry and clean or not applicable  Pain: 2-->pain free  Ambulation: 2-->able to stand up and walk straight, on ordered bedrest, or performing at previous level of functioning  Fasting/Feeding: 2-->able to drink fluids or NPO, minimal nausea/ no vomiting  Urine Output: 2-->has voided, adequate urine output per device, or not applicable  Modified Aldrete Score: 20    Post Sedation  Assessment Scored:: Post-Procedure  Activity: 2-->able to move 4 extremities voluntarily or on command  Respiration: 2-->able to breathe and cough freely  Circulation: 2-->BP within 20% of pre-anesthetic level  Consciousness: 1-->arousable on calling  O2 Saturation: 2-->able to maintain O2 saturation greater than 92% on room air  Dressing: 2-->dry and clean or not applicable  Pain: 2-->pain free  Ambulation: 2-->able to stand up and walk straight, on ordered bedrest, or performing at previous level of functioning  Fasting/Feeding: 2-->able to drink fluids or NPO, minimal nausea/ no vomiting  Urine Output: 2-->has voided, adequate urine output per device, or not applicable  Post Modified Aldrete Score: 19    Sedation Type: Moderate Sedation     Medications (moderate): Versed, Fentanyl  Hospital: [facility]  Unit: [facility]  IV Type: Central Line  Additional Intervention needed:: No           Darletta Moll, RN    Sedation Informed Consent, pre-sedation risk assessment and evaluation  completed.  History of previous adverse experiences with sedation/analgesia assessed.  Monitored conscious sedation was administered under my direct supervision by an appropriately trained sedation nurse.  Appropriate Facility and equipment compliant  Patient was continuously monitored throughout the procedure.  I was in attendance throughout sedation.  See  TEE/Cardioversion Timeline in chart review for additional details.  Ephriam Knuckles, MD  12/17/2016, 10:19  Associate Professor of Medicine  [facility] Department of Medicine; Section of Cardiology  [facility]

## 2016-12-03 NOTE — Nurses Notes (Signed)
Assessment per flow sheet, vitals stable, dressing change per orders, patient encouraged to use call light, will continue to monitor

## 2016-12-03 NOTE — Nurses Notes (Signed)
Patient refused to let stat team pull his picc line, which is to be cultured, explained the need to pull the picc line to patient and he still refused, on call service was paged and they will come and educate the patient on the need to pull the picc line to the patient

## 2016-12-03 NOTE — Progress Notes (Signed)
[facility]  Medicine Progress Note  Full Code    [name]  Date of service: 12/03/2016    Subjective:  Patient completed transesophageal echo.  There were no vegetations present.  Infectious Disease recommended removal of PICC line.  Patient adamantly refused.  I came by had bedside and discussed with him the risks of keeping the PICC line since that may be the source of infection.  He understood that the risks of possible death with this infection.  He would not let anyone to go near PICC line.    Patient denied any fevers or chills.  He did endorse twitching of his upper extremities    Vital Signs:  Temp (24hrs) Max:37.3 C (99.1 F)      Systolic (24hrs), Avg:107 , Min:82 , Max:138     Diastolic (24hrs), Avg:63, Min:40, Max:119    Temp  Avg: 36.8 C (98.3 F)  Min: 36.6 C (97.9 F)  Max: 37.3 C (99.1 F)  Pulse  Avg: 102.7  Min: 94  Max: 113  Resp  Avg: 20  Min: 0  Max: 38  SpO2  Avg: 97.7 %  Min: 86 %  Max: 100 %  MAP (Non-Invasive)  Avg: 75.1 mmHG  Min: 50 mmHG  Max: 125 mmHG  Pain Score (Numeric, Faces): 6  Fi02    I/O:  I/O last 24 hours:      Intake/Output Summary (Last 24 hours) at 12/03/16 1528  Last data filed at 12/03/16 1500   Gross per 24 hour   Intake             3030 ml   Output             1900 ml   Net             1130 ml     I/O current shift:  09/26 0800 - 09/26 1559  In: 1220 [P.O.:620; I.V.:600]  Out: 400 [Urine:400]  Blood Sugars: Last Fingerstick:  No results found for: GLUCOSEPOC      Current Facility-Administered Medications:  acetaminophen (TYLENOL) tablet 650 mg Oral Q6HRS   ascorbic acid (VITAMIN C) tablet 500 mg Oral 2x/day   bacitracin zinc 500 units/gram topical ointment packet 1 Packet Topical 2x/day   baclofen (LIORESAL) tablet 20 mg Oral 3x/day   ceFEPime (MAXIPIME) 2 g in iso-osmotic 100 mL premix IVPB 2 g Intravenous Q8H   DAPTOmycin (CUBICIN) 700 mg in NS 100 mL IVPB 8 mg/kg Intravenous Q24H   escitalopram (LEXAPRO) tablet 20 mg Oral Daily   furosemide (LASIX)  tablet 20 mg Oral QAM   furosemide (LASIX) tablet 10 mg Oral QPM   influenza virus vaccine (PF) IM injection (FLUARIX for ages 13 months and older) 0.5 mL Intramuscular Once   lanolin-oxyquin-pet, hydrophil (BAG BALM) topical ointment  Apply Topically 4x/day   metroNIDAZOLE (FLAGYL) tablet 500 mg Oral Q8HRS   NS flush syringe 2 mL Intracatheter Q8HRS   And      NS flush syringe 2-6 mL Intracatheter Q1 MIN PRN   NS premix infusion  Intravenous Continuous   oxyCODONE (ROXICODONE) immediate release tablet 15 mg Oral Q4H PRN   Or      oxyCODONE (ROXICODONE) immediate release tablet 20 mg Oral Q4H PRN   pantoprazole (PROTONIX) delayed release tablet 40 mg Oral Daily   perflutren lipid microspheres (DEFINITY) 1.1 mg/mL injection 0.3 mL Intravenous Give in Cardiology   pregabalin (LYRICA) capsule 150 mg 150 mg Oral 2x/day   sennosides-docusate sodium (SENOKOT-S)  8.6-50mg  per tablet 1 Tab Oral 2x/day   sodium hypochlorite (DAKINS) 0.125% (quarter strength) irrigation  Irrigation Q12H   sodium hypochlorite 0.0125% (WOUNDCLENZ OTC) irrigation  Irrigation Q12H   zinc sulfate (ZINCATE) 220 mg (50 mg elemental zinc) tablet 220 mg Oral Daily       No Known Allergies    Physical Exam:  General: Appears chronically ill.  No acute distress.  Head: Normocephalic and atraumatic.  No obvious abnormality.  Eyes: Conjunctiva clear.  Pupils equal, round.  Extraocular eye movement intact.    Respiratory: Breathing non-labored. Clear to auscultation bilateral. No wheezes, rhonchi, or rales.  Cardiovascular: Tachycardic. Regular rhythm Normal S1/S2.   Gastrointestinal: Ostomy present. Soft, non-tender.  Bowel sounds normal. No hepatomegaly.  Extremities: No cyanosis or clubbing.  Skin: Bilateral stage IV sacral decubitus ulcers to bilateral hips. Stage IV coccyx ulcer.    Neurologic: A/O x3.  Paraplegia lower extremities.    Psychiatric: Fluent in speech and cognition.    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    Microbiology:   Blood cultures ordered but not yet collected. MRSA from blood cultures on 9/22 and GPC from 9/23.    PT/OT: Yes    Consults:  Plastic surgery    Hardware (lines, foley's, tubes):  Peripheral IV lines, colostomy, PICC    Assessment/ Plan:   Active Hospital Problems    Diagnosis   . Primary Problem: Sepsis (CMS HCC)   . Hepatitis C antibody positive in blood   . Drug-seeking behavior   . Decubitus ulcer     36 year old male with a history of extensive decubitus ulcers, presented with MRSA sepsis and osteomyelitis.    MRSA Sepsis  Osteomyelitis and sacral decubitus stage 4 ulcers, bilateral  -Daptomycin/Cefepime/flagyl continued. (6 week course from last negative blood culture puts his last day of abx on 01/12/17). Dapto was decreased to 6 mg/kg Q24H. Flagyl was added on 12/03/16.  - Of note, his PICC line from the [facility] is still present and patient refuses to have this removed. True end date of antibiotics should be determined after removal of PICC line.  -Plastic surgery team consulted, patient is  not a surgical candidate for a flap procedure  -Patient is s/p ileostomy for decubitus ulcers  -CRP, Procalcitonin downtrending  -PRN Roxicodone for pain control (15 mg Q4H prn and 20 mg Q4H prn)  -Bowel regimen ordered with Miralax and Senokot  -Decubitus ulcer precautions, frequent repositioning  -Blood cultures  positive for MRSA; repeat blood cultures pending. Last positive culture on 9/24.  -TTE and TEE completed with no valve vegetations  - continue 20 mg lasix in AM and 10 mg lasix in evening for leg edema    Anemia, acute blood loss  - Acute blood loss anemia secondary to decubitus ulcers on sacrum vs GI bleed  - Hgb 7.1 this AM. Repeat this afternoon.  - protonix 40 mg twice daily  - Transfuse as needed below 7  - discontinue lovenox  - monitor output from ostomy    Drug-seeking behavior   -Patient is preoccupied with IV pain medications, high addiction potential in this patient   -Counseled patient extensively regarding pain medications   -Pain management team consulted  -Will manage pain with oral opiates    Status post ileostomy  -Ileostomy site healthy  -Ostomy consult generated    Severe protein- calorie malnutrition  -Albumin 1.6 on admission, will repeat   -Pre albumin ordered  -Likely contributing to poor wound healing  -MNT protocol and nutrition consult    Hypomagnesemia  - 1.5 this AM. Replaced with 4 g mag sulfate  - repeat in AM and replace as needed    Hypocalcemia, falsely low  - 6.9 but after correction for low albumin, 8.8.    Hepatitis C   -Hep C screen positive  -HIV screen ordered, pending  -Patient reports that he has not been treated for hepatitis-C in the past  -Will establish with infectious disease clinic on discharge    DVT/PE Prophylaxis: Discontinued lovenox due to low hemoglobin.    Disposition Planning: To be decided, pending PT/OT     Ridwaan Albeiruti, MD 12/03/2016 15:28  [facility] of Medicine  Internal Medicine, PGY-3  Pager [phone]            I saw and examined the patient.  I reviewed the resident's note.  I agree with the findings and plan of care as documented in the resident's note.  Any exceptions/additions are edited/noted.    Noel Christmas, MD

## 2016-12-03 NOTE — Progress Notes (Signed)
[facility]  Post TEE Brief  Note    Name:  [name]  MRN:  [id number]  Date of service:  12/03/2016    Complications:  None    Findings:    No evidence of valvular vegetation noted.     Recommendations:    Return to primary care service.     Full note to follow.    Chriss Driver, MD, 12/03/2016, 09:43  Fellow, Cardiovascular Disease  [facility]    I have seen and evaluated the patient jointly with Dr. Vonzell Schlatter on 12/03/2016 and agree with his assessment, recommendations and plans.    Ephriam Knuckles, MD 12/07/2016 18:57

## 2016-12-03 NOTE — Nurses Notes (Addendum)
Results for ZHION, PEVEHOUSE (MRN [id number]) as of 12/03/2016 07:12   Ref. Range 12/03/2016 06:30   HGB Latest Ref Range: 12.5 - 16.3 g/dL 7.1 (L)   HCT Latest Ref Range: 36.7 - 47.0 % 21.9 (L)     Results for CHANZE, TEAGLE (MRN [id number]) as of 12/03/2016 07:14   Ref. Range 12/03/2016 05:42   CALCIUM Latest Ref Range: 8.5 - 10.2 mg/dL 6.9 (L)     MD notified. Will continue to monitor.

## 2016-12-03 NOTE — Care Plan (Signed)
Problem: Infection, Risk/Actual (Adult)  Goal: Infection Prevention/Resolution  Patient will demonstrate the desired outcomes by discharge/transition of care.   Outcome: Ongoing (see interventions/notes)   12/03/16 2327   Infection, Risk/Actual (Adult)   Infection Prevention/Resolution making progress toward outcome       Problem: Mobility, Physical Impaired (Adult)  Goal: Enhanced Mobility Skills  Patient will demonstrate the desired outcomes by discharge/transition of care.   Outcome: Ongoing (see interventions/notes)   12/03/16 2327   Mobility, Physical Impaired (Adult)   Enhanced Mobility Skills making progress toward outcome     Goal: Enhanced Functional Ability  Patient will demonstrate the desired outcomes by discharge/transition of care.   Outcome: Ongoing (see interventions/notes)   12/03/16 2327   Mobility, Physical Impaired (Adult)   Enhanced Functional Ability making progress toward outcome       Problem: Health Knowledge, Opportunity to Enhance (Adult,Obstetrics,Pediatric)  Goal: Knowledgeable about Health Subject/Topic  Patient will demonstrate the desired outcomes by discharge/transition of care.   Outcome: Ongoing (see interventions/notes)      Problem: Self-Care Deficit (Adult,Obstetrics,Pediatric)  Goal: Improved Ability to Perform BADL and IADL  Patient will demonstrate the desired outcomes by discharge/transition of care.   Outcome: Ongoing (see interventions/notes)   12/03/16 2327   Self-Care Deficit (Adult,Obstetrics,Pediatric)   Improved Ability to Perform BADL and IADL making progress toward outcome       Problem: Surgery Nonspecified (Adult)  Prevent and manage potential problems including:  1. bleeding/anemia  2. bowel motility decreased  3. infection  4. pain  5. postoperative nausea and vomiting  6. postoperative urinary retention  7. respiratory compromise  8. situational response  9. VTE (venous thromboembolism)  10. wound healing impaired   Goal: Signs and Symptoms of Listed Potential  Problems Will be Absent, Minimized or Managed (Surgery Nonspecified)  Signs and symptoms of listed potential problems will be absent, minimized or managed by discharge/transition of care (reference Surgery Nonspecified (Adult) CPG).   Outcome: Ongoing (see interventions/notes)   12/03/16 2327   Surgery Nonspecified   Problems Assessed (Surgery) all   Problems Present (Surgery) pain     Goal: Anesthesia/Sedation Recovery  Outcome: Ongoing (see interventions/notes)   12/03/16 2327   Goal/Outcome Evaluation   Anesthesia/Sedation Recovery progressing toward baseline       Problem: Wound (Includes Pressure Injury) (Adult)  Prevent and manage potential problems including:  1. bleeding  2. delayed wound healing  3. infection  4. pain  5. situational response  6. skin injury, new onset   Goal: Signs and Symptoms of Listed Potential Problems Will be Absent, Minimized or Managed (Wound)  Signs and symptoms of listed potential problems will be absent, minimized or managed by discharge/transition of care (reference Wound (Includes Pressure Injury) (Adult) CPG).   Outcome: Ongoing (see interventions/notes)   12/03/16 2327   Wound (Includes Pressure Injury)   Problems Assessed (Wound) all   Problems Present (Wound) delayed wound healing;infection       Problem: Depression (Adult,Obstetrics,Pediatric)  Goal: Establish/Maintain Self-Care Routine  Patient will demonstrate the desired outcomes by discharge/transition of care.   Outcome: Ongoing (see interventions/notes)   12/03/16 2327   Depression (Adult,Obstetrics,Pediatric)   Establish/Maintain Self-Care Routine making progress toward outcome     Goal: Improved/Stable Mood  Patient will demonstrate the desired outcomes by discharge/transition of care.   Outcome: Ongoing (see interventions/notes)   12/03/16 2327   Depression (Adult,Obstetrics,Pediatric)   Improved/Stable Mood making progress toward outcome       Problem: Behavioral Regulation Impairment (Disruptive  Behavior)  (Adult)  Goal: Improved Impulse/Aggression Control (Disruptive Behavior)  Outcome: Ongoing (see interventions/notes)   12/03/16 2327   Improved Impulse/Aggression Control (Disruptive Behavior)   Improved Impulse/Aggression Control Action Step (STG) Outcome making progress toward outcome       Problem: Social/Occupational/Functional Impairment (Disruptive Behavior) (Adult)  Goal: Improved Social/Occupational/Functional Skills (Disruptive Behavior)  Outcome: Ongoing (see interventions/notes)   12/02/16 1828   Improved Social/Occupational/Functional Skills (Disruptive Behavior)   Improved Social/Occupational/Functional Skills Action Step (STG) Outcome making progress toward outcome       Problem: Patient Care Overview (Adult,OB)  Goal: Plan of Care Review(Adult,OB)  The patient and/or their representative will communicate an understanding of their plan of care   Outcome: Ongoing (see interventions/notes)   12/03/16 2327   Coping/Psychosocial   Plan Of Care Reviewed With patient     Goal: Individualization/Patient Specific Goal(Adult/OB)  Outcome: Ongoing (see interventions/notes)   12/03/16 2327   Individualization/Mutuality   Patient Specific Interventions Prefers door shut     Goal: Interdisciplinary Rounds/Family Conf  Outcome: Ongoing (see interventions/notes)   12/03/16 2327   Interdisciplinary Rounds/Family Conf   Participants nursing       Problem: Fall Risk (Adult)  Goal: Absence of Falls  Patient will demonstrate the desired outcomes by discharge/transition of care.   Outcome: Ongoing (see interventions/notes)   12/03/16 2327   Fall Risk (Adult)   Absence of Falls making progress toward outcome       Comments: Pt's dressings were changed, Pt received pain meds, labs and vital signs were monitored, Pt was encouraged to frequently rotate off wounds.

## 2016-12-03 NOTE — Nurses Notes (Signed)
[facility] service came and talked to the patient about the need to pull the picc line, patient still refuses to have the picc pulled
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
## 2016-12-03 NOTE — Nurses Notes (Addendum)
HOB @ 30 degrees. Slight facial drop pt stated that he has Bells Palsy

## 2016-12-03 NOTE — Care Plan (Signed)
Problem: Infection, Risk/Actual (Adult)  Goal: Infection Prevention/Resolution  Patient will demonstrate the desired outcomes by discharge/transition of care.   Outcome: Ongoing (see interventions/notes)      Problem: Health Knowledge, Opportunity to Enhance (Adult,Obstetrics,Pediatric)  Goal: Knowledgeable about Health Subject/Topic  Patient will demonstrate the desired outcomes by discharge/transition of care.   Outcome: Ongoing (see interventions/notes)      Problem: Wound (Includes Pressure Injury) (Adult)  Prevent and manage potential problems including:  1. bleeding  2. delayed wound healing  3. infection  4. pain  5. situational response  6. skin injury, new onset   Goal: Signs and Symptoms of Listed Potential Problems Will be Absent, Minimized or Managed (Wound)  Signs and symptoms of listed potential problems will be absent, minimized or managed by discharge/transition of care (reference Wound (Includes Pressure Injury) (Adult) CPG).   Outcome: Ongoing (see interventions/notes)      Problem: Depression (Adult,Obstetrics,Pediatric)  Goal: Establish/Maintain Self-Care Routine  Patient will demonstrate the desired outcomes by discharge/transition of care.   Outcome: Ongoing (see interventions/notes)    Goal: Improved/Stable Mood  Patient will demonstrate the desired outcomes by discharge/transition of care.   Outcome: Ongoing (see interventions/notes)      Problem: Skin Injury Risk (Adult,Obstetrics,Pediatric)  Goal: Skin Health and Integrity  Patient will demonstrate the desired outcomes by discharge/transition of care.   Outcome: Ongoing (see interventions/notes)      Problem: Patient Care Overview (Adult,OB)  Goal: Plan of Care Review(Adult,OB)  The patient and/or their representative will communicate an understanding of their plan of care   Outcome: Ongoing (see interventions/notes)  Plan of care reviewed with patient. Alert, oriented and agreeable to care. Refuses BA at this time, agreeable to VM,  currently on wait list. Patient utilizes trapeze to move around in bed. Dressings changed as needed. PRN pain medication for patient discomfort. Patient refuses SCD's and Rooke boots. Foley patent and draining clear, amber urine. PICC line in LUA, normal saline running at 75 ml/hr. Colostomy drained by patient. Blood cultures drawn.  Call bell within reach. Hourly rounding for needs. Will continue to monitor IO's, labs, and VS.    Problem: Fall Risk (Adult)  Goal: Identify Related Risk Factors and Signs and Symptoms  Related risk factors and signs and symptoms are identified upon initiation of Human Response Clinical Practice Guideline (CPG).   Outcome: Completed Date Met: 12/03/16    Goal: Absence of Falls  Patient will demonstrate the desired outcomes by discharge/transition of care.   Outcome: Ongoing (see interventions/notes)

## 2016-12-04 DIAGNOSIS — R718 Other abnormality of red blood cells: Secondary | ICD-10-CM

## 2016-12-04 DIAGNOSIS — D509 Iron deficiency anemia, unspecified: Secondary | ICD-10-CM

## 2016-12-04 DIAGNOSIS — D7589 Other specified diseases of blood and blood-forming organs: Secondary | ICD-10-CM

## 2016-12-04 LAB — BASIC METABOLIC PANEL
ANION GAP: 5 mmol/L (ref 4–13)
BUN/CREA RATIO: 21 (ref 6–22)
BUN: 18 mg/dL (ref 8–25)
CALCIUM: 8.3 mg/dL — ABNORMAL LOW (ref 8.5–10.2)
CHLORIDE: 112 mmol/L — ABNORMAL HIGH (ref 96–111)
CO2 TOTAL: 19 mmol/L — ABNORMAL LOW (ref 22–32)
CREATININE: 0.85 mg/dL (ref 0.62–1.27)
ESTIMATED GFR: >59 mL/min/1.73mˆ2 (ref >59)
GLUCOSE: 98 mg/dL (ref 65–139)
POTASSIUM: 4.2 mmol/L (ref 3.5–5.1)
SODIUM: 136 mmol/L (ref 136–145)

## 2016-12-04 LAB — HEPATIC FUNCTION PANEL
ALBUMIN: 1.5 g/dL — ABNORMAL LOW (ref 3.5–5.0)
ALKALINE PHOSPHATASE: 250 U/L — ABNORMAL HIGH (ref <150)
ALT (SGPT): 17 U/L (ref <55)
AST (SGOT): 21 U/L (ref 8–48)
BILIRUBIN DIRECT: 0.1 mg/dL (ref <0.3)
BILIRUBIN TOTAL: <0.2 mg/dL — ABNORMAL LOW (ref 0.3–1.3)
PROTEIN TOTAL: 7.3 g/dL (ref 6.4–8.3)

## 2016-12-04 LAB — GIEMSA STAIN (CBC W/DIFF), WITH PATHOLOGIST REVIEW
BASOPHIL #: 0.05 x10ˆ3/uL (ref 0.00–0.20)
BASOPHIL %: 1 %
EOSINOPHIL #: 0.15 x10ˆ3/uL (ref 0.00–0.50)
EOSINOPHIL %: 3 %
HCT: 31.3 % — ABNORMAL LOW (ref 36.7–47.0)
HGB: 9.8 g/dL — ABNORMAL LOW (ref 12.5–16.3)
LYMPHOCYTE #: 1.05 x10ˆ3/uL (ref 1.00–4.80)
LYMPHOCYTE %: 22 %
MCH: 25.4 pg — ABNORMAL LOW (ref 27.4–33.0)
MCHC: 31.2 g/dL — ABNORMAL LOW (ref 32.5–35.8)
MCV: 81.6 fL (ref 78.0–100.0)
MONOCYTE #: 0.55 x10ˆ3/uL (ref 0.30–1.00)
MONOCYTE %: 12 %
MPV: 6.4 fL — ABNORMAL LOW (ref 7.5–11.5)
MPV: 6.4 fL — ABNORMAL LOW (ref 7.5–11.5)
NEUTROPHIL #: 3 x10ˆ3/uL (ref 1.50–7.70)
NEUTROPHIL %: 63 %
PLATELETS: 352 x10ˆ3/uL (ref 140–450)
RBC: 3.83 x10ˆ6/uL — ABNORMAL LOW (ref 4.06–5.63)
RDW: 18.9 % — ABNORMAL HIGH (ref 12.0–15.0)
WBC: 4.8 x10ˆ3/uL (ref 3.5–11.0)

## 2016-12-04 LAB — C-REACTIVE PROTEIN(CRP),INFLAMMATION: CRP INFLAMMATION: 145.8 mg/L — ABNORMAL HIGH (ref <=8.0)

## 2016-12-04 LAB — PHOSPHORUS: PHOSPHORUS: 3.7 mg/dL (ref 2.4–4.7)

## 2016-12-04 LAB — MAGNESIUM: MAGNESIUM: 1.9 mg/dL (ref 1.6–2.5)

## 2016-12-04 LAB — PATH COMMENT (GIEMSA STAIN): PATHOLOGIST INTERPRETATION: ABNORMAL — AB

## 2016-12-04 MED ORDER — ENOXAPARIN 40 MG/0.4 ML SUBCUTANEOUS SYRINGE
40.0000 mg | INJECTION | SUBCUTANEOUS | Status: DC
Start: 2016-12-04 — End: 2016-12-04

## 2016-12-04 MED ADMIN — metroNIDAZOLE 250 mg tablet: ORAL | @ 05:00:00

## 2016-12-04 MED ADMIN — furosemide 20 mg tablet: ORAL | @ 05:00:00

## 2016-12-04 MED ADMIN — dextrose 5 % and 0.9 % sodium chloride intravenous solution: ORAL | @ 17:00:00 | NDC 00338008904

## 2016-12-04 MED ADMIN — sodium chloride 0.9 % (flush) injection syringe: ORAL | @ 08:00:00

## 2016-12-04 MED ADMIN — lactated Ringers intravenous solution: ORAL | @ 20:00:00

## 2016-12-04 MED ADMIN — sodium chloride 0.9 % intravenous solution: INTRAVENOUS | @ 12:00:00 | NDC 00338004904

## 2016-12-04 MED ADMIN — lactated Ringers intravenous solution: ORAL | @ 08:00:00 | NDC 00338011704

## 2016-12-04 MED ADMIN — nystatin 100,000 unit/gram topical powder: TOPICAL | @ 14:00:00 | NDC 00574200815

## 2016-12-04 NOTE — Progress Notes (Signed)
[facility]  Medicine Progress Note  Full Code    Margaretha Glassing  Date of service: 12/04/2016    Subjective:  Refusing wound care dressing changes per nursing.  He would not like to have his PICC line removed.  Patient's pain appears to be well controlled.  He is eating well.  He denies any fevers or chills.  I spoke to his mother on the phone today and updated her regarding his plan of care.  Patient refusing to have his Foley taken out.     Vital Signs:  Temp (24hrs) Max:36.8 C (98.3 F)      Systolic (24hrs), Avg:130 , Min:113 , Max:140     Diastolic (24hrs), Avg:70, Min:61, Max:75    Temp  Avg: 36.7 C (98.1 F)  Min: 36.6 C (97.9 F)  Max: 36.8 C (98.3 F)  Pulse  Avg: 100  Min: 96  Max: 104  Resp  Avg: 20  Min: 20  Max: 20  MAP (Non-Invasive)  Avg: 84 mmHG  Min: 76 mmHG  Max: 91 mmHG  Pain Score (Numeric, Faces): 7  Fi02    I/O:  I/O last 24 hours:      Intake/Output Summary (Last 24 hours) at 12/04/16 1401  Last data filed at 12/04/16 0654   Gross per 24 hour   Intake             2210 ml   Output             1750 ml   Net              460 ml     I/O current shift:     Blood Sugars: Last Fingerstick:  No results found for: GLUCOSEPOC      Current Facility-Administered Medications:  acetaminophen (TYLENOL) tablet 650 mg Oral Q6HRS   ascorbic acid (VITAMIN C) tablet 500 mg Oral 2x/day   bacitracin zinc 500 units/gram topical ointment packet 1 Packet Topical 2x/day   baclofen (LIORESAL) tablet 20 mg Oral 3x/day   ceFEPime (MAXIPIME) 2 g in iso-osmotic 100 mL premix IVPB 2 g Intravenous Q8H   DAPTOmycin (CUBICIN) 550 mg in NS 100 mL IVPB 6 mg/kg Intravenous Q24H   escitalopram (LEXAPRO) tablet 20 mg Oral Daily   furosemide (LASIX) tablet 20 mg Oral QAM   furosemide (LASIX) tablet 10 mg Oral QPM   influenza virus vaccine (PF) IM injection (FLUARIX for ages 49 months and older) 0.5 mL Intramuscular Once   lanolin-oxyquin-pet, hydrophil (BAG BALM) topical ointment  Apply Topically 4x/day   metroNIDAZOLE  (FLAGYL) tablet 500 mg Oral Q8HRS   NS flush syringe 2 mL Intracatheter Q8HRS   And      NS flush syringe 2-6 mL Intracatheter Q1 MIN PRN   oxyCODONE (ROXICODONE) immediate release tablet 15 mg Oral Q4H PRN   Or      oxyCODONE (ROXICODONE) immediate release tablet 20 mg Oral Q4H PRN   pantoprazole (PROTONIX) delayed release tablet 40 mg Oral Daily   perflutren lipid microspheres (DEFINITY) 1.1 mg/mL injection 0.3 mL Intravenous Give in Cardiology   pregabalin (LYRICA) capsule 150 mg 150 mg Oral 2x/day   sennosides-docusate sodium (SENOKOT-S) 8.6-50mg  per tablet 1 Tab Oral 2x/day   sodium hypochlorite (DAKINS) 0.125% (quarter strength) irrigation  Irrigation Q12H   sodium hypochlorite 0.0125% (WOUNDCLENZ OTC) irrigation  Irrigation Q12H   zinc sulfate (ZINCATE) 220 mg (50 mg elemental zinc) tablet 220 mg Oral Daily       No Known Allergies  Physical Exam:  General: Appears chronically ill.  No acute distress.  Head: Normocephalic and atraumatic.  No obvious abnormality.  Eyes: Conjunctiva clear.  Pupils equal, round.  Extraocular eye movement intact.    Respiratory: Breathing non-labored. Clear to auscultation bilateral. No wheezes, rhonchi, or rales.  Cardiovascular: Tachycardic. Regular rhythm Normal S1/S2.   Gastrointestinal: Ostomy present. Soft, non-tender.  Bowel sounds normal. No hepatomegaly.  Extremities: No cyanosis or clubbing.  Skin: Bilateral stage IV sacral decubitus ulcers to bilateral hips. Stage IV coccyx ulcer.    Neurologic: A/O x3.  Paraplegia lower extremities.    Psychiatric: Fluent in speech and cognition.    Labs:  CBC   Recent Labs      12/02/16   0331  12/02/16   1507  12/03/16   0630  12/03/16   1639  12/04/16   0530   WBC  8.1   --   6.5   --   4.8   HGB  6.8*  7.8*  7.1*  7.5*  9.8*   HCT  21.7*  24.7*  21.9*  24.0*  31.3*   PLTCNT  373   --   389   --   352   BANDS  2   --    --    --    --         BMP   Recent Labs      12/02/16   0331  12/03/16   0542  12/04/16   0530   SODIUM  135*   143  136   POTASSIUM  4.1  3.5  4.2   CHLORIDE  110  120*  112*   CO2  19*  17*  19*   BUN  CREATININE  1.17  0.71  0.85   GLUCOSENF  107  63*  98   ANIONGAP  BUNCRRATIO  GFR  >59  >59  >59         Radiology:  Hip XR: Multifocal pelvic osteomyelitis with large bilateral decubitus ulcers.    Microbiology:   Blood cultures ordered but not yet collected. MRSA from blood cultures on 9/22 and GPC from 9/23.    PT/OT: Yes    Consults:  Plastic surgery, pain management    Hardware (lines, foley's, tubes):  Peripheral IV lines, colostomy, PICC, foley catheter    Assessment/ Plan:   Active Hospital Problems    Diagnosis    Primary Problem: Sepsis (CMS HCC)    Hepatitis C antibody positive in blood    Drug-seeking behavior    Decubitus ulcer     36 year old male with a history of extensive decubitus ulcers, presented with MRSA sepsis and osteomyelitis.    MRSA Sepsis  Osteomyelitis and sacral decubitus stage 4 ulcers, bilateral  -Daptomycin/Cefepime/flagyl continued. (6 week course from last negative blood culture puts his last day of abx on 01/12/17). OPAT consulted.  - refuses to have his PICC line removed. Refuses to have foley removed.  -Plastic surgery team consulted, patient is not a surgical candidate for a flap procedure  -Patient is s/p ileostomy for decubitus ulcers  -CRP downtrending  -PRN Roxicodone for pain control (15 mg Q4H prn and 20 mg Q4H prn)  -Bowel regimen ordered with Miralax and Senokot  -Decubitus ulcer precautions, frequent repositioning  -Blood cultures positive for MRSA; repeat blood cultures NGTD. Last positive culture on  9/24.  -TTE and TEE completed with no valve vegetations  - continue 20 mg lasix in AM and 10 mg lasix in evening for leg edema    Anemia, acute blood loss  - Acute blood loss anemia secondary to decubitus ulcers on sacrum vs GI bleed  - hemoglobin 9.8 this morning, which is greatly improved from yesterday and the day before.  - protonix 40 mg  twice daily  - Transfuse as needed below 7  - will restart Lovenox tomorrow if Hgb doesn't drop  - monitor output from ostomy    Drug-seeking behavior   -Patient is preoccupied with IV pain medications, high addiction potential in this patient   -Counseled patient extensively regarding pain medications   -Pain management team consulted  -Will manage pain with oral opiates    Status post ileostomy  -Ileostomy site healthy  -Ostomy consult generated    Severe protein- calorie malnutrition  -Albumin 1.5 today   -Pre albumin ordered  -Likely contributing to poor wound healing  -MNT protocol and nutrition consult    Hypomagnesemia  - 1.5 this AM. Replaced with 4 g mag sulfate  - repeat in AM and replace as needed    Hypocalcemia, resolved  - 8.3 today; 10.3 with albumin correction    Hepatitis C   -Hep C screen positive  -HIV screen negative  -Patient reports that he has not been treated for hepatitis-C in the past  -Will establish with infectious disease clinic on discharge    DVT/PE Prophylaxis:  Will consider restarting Lovenox tomorrow if hemoglobin remains stable    Disposition Planning: To be decided, pending PT/OT     Ridwaan Albeiruti, MD 12/04/2016 14:01  [facility] Medicine  Internal Medicine, PGY-3  Pager [phone]      I saw and examined the patient.  I reviewed the resident's note.  I agree with the findings and plan of care as documented in the resident's note.  Any exceptions/additions are edited/noted.    Noel Christmas, MD

## 2016-12-04 NOTE — Nurses Notes (Addendum)
Patient refused wound care changes on sacrum, and hip. Pt states "i do them myself at home, I dont need them done. I will talk to a doctor later this morning." will continue to monitor. Dr. Johnsie Kindred notified.

## 2016-12-04 NOTE — Consults (Signed)
Outpatient Parenteral Antimicrobial Therapy Program    Patient Name: [name]  MRN: [id number]  Patient Address: [address] [place] [address]  DOB: 08/07/1980  Date: 12/04/16    Outpatient Antimicrobial Treatment Plan     Diagnosis Group:  Diagnosis:   Microorganisms:   Bone and joint infection MRSA bacteremia and chronic osteomyelitis History of multiple organisms including Acinetobacter, E coli, E faeciumand Morganella, VRE. This admission MRSA bacteremia        Antibiotics  IV Antibiotics:  Dose  Frequency Anticipated Stop date:  Comments:   Daptomycin 559m Every 24 hours  01/12/17    Cefepime 2gm Every 8 hours  01/12/17    Oral Antibiotics:  Dose Frequency Anticipated Stop date:  Comment:   Metronidazole 5058mThree times a day  01/12/17    6 weeks of therapy     Important drug interactions:      Allergies:          Labs monitoring plan/orders  Lab Lab Frequency Additional Comments    CBC/diff, BUN, Creatinine, AST, ALT, alk phos, total bilirubin, CRP and CK level   Every Monday        OPAT Pharmacist:  Dr. MeTobie PoetPhone Number: [phone] Labs To: [phone]   Physician Monitoring OPAT Course:   Dr. NaCharyl Danceroidad  Phone Number: [phone] Labs To: [place]     [place]     SNF/nursing home/rehab          Physician to be seen for follow-up:      Follow-Up Date:   Follow-Up Time:         Attending Physician    KrBoone MasterPharmD  Infectious Diseases Clinical Pharmacist  [phone]

## 2016-12-04 NOTE — Nurses Notes (Signed)
Orders received to remove foley catheter. RN went in to tell patient of plan and patient said there is no reason to remove the foley because he cannot control his bladder. RN asked patient how he voided prior to hospitalization. Patient said "You're asking me to many questions, I'm good. You're not taking out my urine bag." MD Albeiruti paged and made aware of patient's refusal.

## 2016-12-04 NOTE — Care Management Notes (Signed)
[facility]  Care Management Note    Patient Name: [name]  Date of Birth: [birth year]  Sex: male  Date/Time of Admission: 11/29/2016 10:31 PM  Room/Bed: 824/A  Payor: MEDICARE / Plan: MEDICARE PART A AND B / Product Type: Medicare /    LOS: 5 days   PCP: No Pcp    Admitting Diagnosis:  Decubitus ulcer [L89.90]  Decubitus ulcer [L89.90]    Assessment:      12/04/16 1556   Assessment Details   Assessment Type Continued Assessment   Date of Care Management Update 12/04/16   Date of Next DCP Update 12/05/16   Care Management Plan   Discharge Planning Status plan in progress   Projected Discharge Date 12/05/16   CM will evaluate for rehabilitation potential yes   Patient choice offered to patient/family yes   Form for patient choice reviewed/signed and on chart yes   Facility or Agency Preferences [facility]        Discharge Plan:  LTAC (code 71)  Patient remains in SD, on telemetry, monitoring respiratory status, pain management.  Patient will need dapto/cefepime/flagyl end date 01/12/17.  Plastics team consulted-not surgical candidate for flap procedure.  Patient to return to [facility] in [place] per agreement in [facility] intake report on 9/22.  [facility] liason Melanie aware.       The patient will continue to be evaluated for developing discharge needs.     Case Manager: Kathryne Gin, LGSW  Phone: [phone]

## 2016-12-04 NOTE — Care Plan (Addendum)
[facility]  Medical Nutrition Therapy Follow Up    SUBJECTIVE: Met with pt this afternoon at bedside. Pt was mumbling and difficult to answer. He would only answer questions with yes and no. Reports a good appetite and is ordering double portions. Reports that he is selecting high protein foods with his meals. Reports drinking Juven and Ensure. He does not like the Juven but responded yes when asked if he will continue to drink it. 3+ BLE edema. Labs noted.     OBJECTIVE:     Current Diet Order/Nutrition Support:  MNT PROTOCOL FOR DIETITIAN  DIETARY ORAL SUPPLEMENTS Oral Supplements with tray: Juven - Fruit Punch (limited to dietitian ordering only); LUNCH/DINNER; 1 Can  MNT PROTOCOL FOR DIETITIAN  DIETARY ORAL SUPPLEMENTS Oral Supplements with tray: Ensure High Protein-Chocolate; BREAKFAST/DINNER; 1 Can  DIET REGULAR Additional modifications/limitations: DOUBLE PORTIONS     Height Used for Calculations: 182.9 cm (6' 0.01")  Weight Used For Calculations: 90.5 kg (199 lb 8.3 oz)  BMI (kg/m2): 27.11  BMI Assessment: BMI 25-29.9: overweight  Usual Body Weight: 108.9 kg (240 lb)  Weight Loss:  (41lb weight loss in 1 year (17% weight loss))  AdjIBW: 72.81 (10% reduction - adjusted for parapalegia)   %adjIBW: 124%     Re-assessed needs if applicable:    Energy Calorie Requirements: 3000-3150 kcals per day (33-35 kcals/BW 90.5 kg)  Protein Requirements (gms/day): 109-124 grams per day (1.5-1.7 g/AdjIBW 72.8 kg)  Fluid Requirements: 3000-3150 mls per day (33-35 mLs/BW 90.5 kg)    Comments: 36 year old male with a history of extensive decubitus ulcers, presented with MRSA sepsis and osteomyelitis.    Recommend :   1. Continue Regular, Double Portion Diet  2. Continue Ensure High Protein BID  3. Continue Juven BID  4. Continue Vitamin C  ( 30 day course) and Zinc Sulfate ( 10 day course) to promote wound healing, suggest Vitamin A - 10,000IU daily for 10 days  5. Encourage high protein snacks between  snacks and meals  6. Daily weights    Will continue to follow.      Nutrition Diagnosis: Increased nutrient needs related to bilateral stage IV decubitus ulcers as evidenced by need for wound healing supplements (in-progress)      Dorothyann Peng, RDLD  12/04/2016, 15:23  Pager # [phone]      Problem: Skin Injury Risk (Adult,Obstetrics,Pediatric)  Goal: Skin Health and Integrity  Patient will demonstrate the desired outcomes by discharge/transition of care.   Outcome: Ongoing (see interventions/notes)      Problem: Patient Care Overview (Adult,OB)  Goal: Plan of Care Review(Adult,OB)  The patient and/or their representative will communicate an understanding of their plan of care   Outcome: Ongoing (see interventions/notes)

## 2016-12-04 NOTE — Nurses Notes (Signed)
Orders placed for patient to be floor status. Consulting civil engineer notified.

## 2016-12-04 NOTE — Nurses Notes (Signed)
Patient continues to refuse dressing change. MD stated "It is ok if he does his own dressing changes." Patient refuses to get washed up and allow staff to change bed linens. Linens visibly soiled with serosanguineous fluid. Patient refusing to allow house keeping to clean room. Patient refusing vitals at this time. MD Albeiruti text paged and updated on patient's behavior.

## 2016-12-04 NOTE — Care Management Notes (Addendum)
MSW went to obtain choice from patient for [facility] in [place].  Patient unable to sign at this time.  Obtained verbal confirmation w/ bedside nurse as witness. Placed choice form in patient's hard chart      Care Management: Kathryne Gin, MSW  Phone: [phone]

## 2016-12-04 NOTE — Nurses Notes (Signed)
Patient refusing to have dressings changed to pressure ulcers. MD Albeiruti paged and made aware. Will attempt to change the dressings later.

## 2016-12-05 LAB — CBC WITH DIFF
BASOPHIL #: 0.08 x10ˆ3/uL (ref 0.00–0.20)
BASOPHIL %: 1 %
EOSINOPHIL #: 0.17 x10ˆ3/uL (ref 0.00–0.50)
EOSINOPHIL %: 2 %
HCT: 24.3 % — ABNORMAL LOW (ref 36.7–47.0)
HGB: 7.7 g/dL — ABNORMAL LOW (ref 12.5–16.3)
HGB: 7.7 g/dL — ABNORMAL LOW (ref 12.5–16.3)
LYMPHOCYTE #: 1.33 x10ˆ3/uL (ref 1.00–4.80)
LYMPHOCYTE %: 16 %
MCH: 25.7 pg — ABNORMAL LOW (ref 27.4–33.0)
MCHC: 31.5 g/dL — ABNORMAL LOW (ref 32.5–35.8)
MCV: 81.5 fL (ref 78.0–100.0)
MONOCYTE #: 0.88 x10ˆ3/uL (ref 0.30–1.00)
MONOCYTE %: 11 %
MPV: 6.4 fL — ABNORMAL LOW (ref 7.5–11.5)
NEUTROPHIL #: 5.65 x10ˆ3/uL (ref 1.50–7.70)
NEUTROPHIL %: 70 %
PLATELETS: 448 x10ˆ3/uL (ref 140–450)
RBC: 2.98 x10ˆ6/uL — ABNORMAL LOW (ref 4.06–5.63)
RDW: 19.5 % — ABNORMAL HIGH (ref 12.0–15.0)
WBC: 8.1 x10ˆ3/uL (ref 3.5–11.0)

## 2016-12-05 LAB — BASIC METABOLIC PANEL
ANION GAP: 6 mmol/L (ref 4–13)
BUN/CREA RATIO: 19 (ref 6–22)
BUN: 15 mg/dL (ref 8–25)
CALCIUM: 8.3 mg/dL — ABNORMAL LOW (ref 8.5–10.2)
CHLORIDE: 109 mmol/L (ref 96–111)
CO2 TOTAL: 20 mmol/L — ABNORMAL LOW (ref 22–32)
CREATININE: 0.79 mg/dL (ref 0.62–1.27)
ESTIMATED GFR: >59 mL/min/1.73mˆ2 (ref >59)
GLUCOSE: 94 mg/dL (ref 65–139)
POTASSIUM: 4.3 mmol/L (ref 3.5–5.1)
SODIUM: 135 mmol/L — ABNORMAL LOW (ref 136–145)

## 2016-12-05 MED ORDER — CEFEPIME 2 GRAM/100 ML IN DEXTROSE (ISO-OSMOTIC) INTRAVENOUS PIGGYBACK: 2 g | Freq: Three times a day (TID) | INTRAVENOUS | 0 refills | 0 days | Status: AC

## 2016-12-05 MED ORDER — SODIUM CHLORIDE 0.9 % INTRAVENOUS SOLUTION: 6 mg/kg | INTRAVENOUS | 0 refills | 0 days | Status: AC

## 2016-12-05 MED ORDER — METRONIDAZOLE 500 MG TABLET: 500 mg | Tab | Freq: Three times a day (TID) | ORAL | 0 refills | 0 days | Status: AC

## 2016-12-05 MED ADMIN — lanolin-oxyquin-pet, hydrophil topical ointment: TOPICAL | @ 14:00:00

## 2016-12-05 MED ADMIN — acetaminophen 325 mg tablet: ORAL | @ 12:00:00

## 2016-12-05 MED ADMIN — sodium chloride 0.9 % (flush) injection syringe: @ 14:00:00

## 2016-12-05 MED ADMIN — PANTOPRAZOLE 80MG IN NS 100ML CONTINUOUS INFUSION: @ 05:00:00 | NDC 00008092355

## 2016-12-05 MED ADMIN — haloperidol lactate 5 mg/mL injection solution: ORAL | @ 14:00:00

## 2016-12-05 NOTE — Discharge Instructions (Signed)
Discharge Recommendations/ Plan:Discharge ZO:[id number] (code 82)      Resources: Nursing to call report to [facility] in [place] [phone]

## 2016-12-05 NOTE — Care Plan (Signed)
Problem: Patient Care Overview (Adult,OB)  Goal: Individualization/Patient Specific Goal(Adult/OB)  Outcome: Ongoing (see interventions/notes)

## 2016-12-05 NOTE — Care Plan (Signed)
Problem: Infection, Risk/Actual (Adult)  Goal: Infection Prevention/Resolution  Patient will demonstrate the desired outcomes by discharge/transition of care.   Outcome: Ongoing (see interventions/notes)      Problem: Mobility, Physical Impaired (Adult)  Goal: Enhanced Mobility Skills  Patient will demonstrate the desired outcomes by discharge/transition of care.   Outcome: Ongoing (see interventions/notes)    Goal: Enhanced Functional Ability  Patient will demonstrate the desired outcomes by discharge/transition of care.   Outcome: Ongoing (see interventions/notes)      Problem: Health Knowledge, Opportunity to Enhance (Adult,Obstetrics,Pediatric)  Goal: Knowledgeable about Health Subject/Topic  Patient will demonstrate the desired outcomes by discharge/transition of care.   Outcome: Ongoing (see interventions/notes)      Problem: Self-Care Deficit (Adult,Obstetrics,Pediatric)  Goal: Improved Ability to Perform BADL and IADL  Patient will demonstrate the desired outcomes by discharge/transition of care.   Outcome: Ongoing (see interventions/notes)      Problem: Wound (Includes Pressure Injury) (Adult)  Prevent and manage potential problems including:  1. bleeding  2. delayed wound healing  3. infection  4. pain  5. situational response  6. skin injury, new onset   Goal: Signs and Symptoms of Listed Potential Problems Will be Absent, Minimized or Managed (Wound)  Signs and symptoms of listed potential problems will be absent, minimized or managed by discharge/transition of care (reference Wound (Includes Pressure Injury) (Adult) CPG).   Outcome: Ongoing (see interventions/notes)      Problem: Depression (Adult,Obstetrics,Pediatric)  Goal: Establish/Maintain Self-Care Routine  Patient will demonstrate the desired outcomes by discharge/transition of care.   Outcome: Ongoing (see interventions/notes)    Goal: Improved/Stable Mood  Patient will demonstrate the desired outcomes by discharge/transition of care.   Outcome:  Ongoing (see interventions/notes)      Problem: Skin Injury Risk (Adult,Obstetrics,Pediatric)  Goal: Skin Health and Integrity  Patient will demonstrate the desired outcomes by discharge/transition of care.   Outcome: Ongoing (see interventions/notes)      Problem: Patient Care Overview (Adult,OB)  Goal: Plan of Care Review(Adult,OB)  The patient and/or their representative will communicate an understanding of their plan of care   Outcome: Ongoing (see interventions/notes)  Care plan reviewed with patient.  Patient admitted with sepsis and large pressure wounds to sacrum/hips.  TEE completed.  Colostomy in place to LUQ, replaced and CDI with formed brown stool.  Foley in place draining clear yellow urine and patient refusing to D/C do to incontinence.  IVABX continued.  Patient refusing removal of L PICC line.  Pain controlled with PRN meds.  Patient encouraged to reposition frequently in bed and verbalized understanding.  Refused repositioning help from staff members.  Complete bath and linen changed during shift.  Wound dressing changed to sacrum/hips completed during shift per kardex orders, continued twice a day.  Will continue to monitor labs and VS.    Problem: Fall Risk (Adult)  Goal: Absence of Falls  Patient will demonstrate the desired outcomes by discharge/transition of care.   Outcome: Ongoing (see interventions/notes)

## 2016-12-05 NOTE — Nurses Notes (Signed)
Complaints of SOB. No chest pain. Oxygen saturation 98% RA. No increased work of breathing. No physical signs of distress. Patient respiratory rate 16. Service paged. No new orders,

## 2016-12-05 NOTE — Care Management Notes (Signed)
[facility] in [place] willing to take patient.  Will not have bed available until Monday morning.  Tasked down to CM assistance to place ambulance on will call for transportation for tentatively around 1030am.  Updated service. Number for nursing report in the AVS.      Care Management: Kathryne Gin, MSW  Phone: [phone]

## 2016-12-05 NOTE — Discharge Summary (Signed)
[facility]  DISCHARGE SUMMARY    PATIENT NAMEKishan, [name]  MRN:  [id number]  DOB:  October 07, 1980    ENCOUNTER DATE:  11/29/2016  INPATIENT ADMISSION DATE: 11/29/2016  DISCHARGE DATE:  12/08/2016    ATTENDING PHYSICIAN: Walker Kehr, MD  SERVICE: MED HOSPITALIST 1  PRIMARY CARE PHYSICIAN: No Pcp       PRIMARY DISCHARGE DIAGNOSIS: Sepsis (CMS [facility])  Active Hospital Problems    Diagnosis Date Noted   . Principle Problem: Sepsis (CMS [facility]) 12/01/2016   . Hepatitis C antibody positive in blood 12/01/2016   . Drug-seeking behavior 12/01/2016   . Decubitus ulcer 11/29/2016      Resolved Hospital Problems    Diagnosis    No resolved problems to display.     There are no active non-hospital problems to display for this patient.       DISCHARGE MEDICATIONS:     Current Discharge Medication List      START taking these medications.       Details    bacitracin zinc 500 unit/gram Packet    1 Packet, Topical, 2x/day   Refills:  0       ceFEPime 2 gram/100 mL Piggyback   Commonly known as:  MAXIPIME    2 g, Intravenous, Q8H   Refills:  0       DAPTOmycin 550 mg in NS 111 mL infusion    6 mg/kg, Intravenous, Q24H, Mix and infuse per policy of [facility].   Refills:  0       lanolin-oxyquin-pet, hydrophil Ointment   Commonly known as:  BAG BALM    Apply Topically, 4x/day   Refills:  0       metroNIDAZOLE 500 mg Tablet   Commonly known as:  FLAGYL    500 mg, Oral, Q8HRS   Qty:  114 Tab   Refills:  0       sennosides-docusate sodium 8.6-50 mg Tablet   Commonly known as:  SENOKOT-S    1 Tab, Oral, 2x/day   Refills:  0       * sodium hypochlorite 0.125 % Solution   Commonly known as:  DAKINS    0.125% twice daily   Refills:  0       * sodium hypochlorite 0.0125 % Solution   Commonly known as:  DI-DAK-SOL    0.0125% irrigation twice daily   Refills:  0       * Notice:  This list has 2 medication(s) that are the same as other medications prescribed for you. Read the directions carefully, and ask your doctor or other care  provider to review them with you.      CONTINUE these medications - NO CHANGES were made during your visit.       Details    acetaminophen 325 mg Tablet   Commonly known as:  TYLENOL    325 mg, Oral, Q4H PRN   Refills:  0       Baclofen 20 mg Tablet   Commonly known as:  LIORESAL    20 mg, Oral, 3x/day   Refills:  0       escitalopram oxalate 20 mg Tablet   Commonly known as:  LEXAPRO    20 mg, Oral, Daily   Refills:  0       * furosemide 20 mg Tablet   Commonly known as:  LASIX    20 mg, Oral, QAM   Refills:  0       *  furosemide 20 mg Tablet   Commonly known as:  LASIX    10 mg, Oral, QPM   Refills:  0       multivitamin Tablet    1 Tab, Oral, Daily   Refills:  0       oxyCODONE 5 mg Capsule   Commonly known as:  OXY IR    10 mg, Oral, Q4H PRN   Refills:  0       pantoprazole 40 mg Tablet, Delayed Release (E.C.)   Commonly known as:  PROTONIX    40 mg, Oral, Daily   Refills:  0       pregabalin 150 mg Capsule   Commonly known as:  LYRICA    150 mg, Oral, 2x/day   Refills:  0       * Notice:  This list has 2 medication(s) that are the same as other medications prescribed for you. Read the directions carefully, and ask your doctor or other care provider to review them with you.      STOP taking these medications.          docusate sodium 100 mg Capsule   Commonly known as:  COLACE             ALLERGIES:  No Known Allergies    During this hospitalization did the patient have an AMI, PCI/PCTA, STENT or Isolated CABG?  No              HOSPITAL PROCEDURE(S): None    REASON FOR HOSPITALIZATION AND HOSPITAL COURSE     BRIEF HPI:  [name] is a 36 y.o., male with history of stage IV sacral decubitus ulcers, depression, hepatitis-C, IV drug use who presents with worsened stage IV decubitus ulcers.        BRIEF HOSPITAL NARRATIVE:  On admission, the patient was febrile to 102.7 F and tachycardic.  The patient has a colostomy.  On admission he was found to have grown MRSA in his blood.  After his infection cleared, a PICC  line was placed. Infectious disease saw the patient. He was started on daptomycin, cefepime, Flagyl.  His last day of antibiotics will be January 12, 2017. Plastic surgery was consulted here and deemed him not to be a poor surgical candidate. A TTE and TEE were completed and did not show any valve vegetations. His blood cultures were repeated have been negative since 12/01/16.  Wound Care evaluated the patient but the patient did not allow the nurses to change his dressings.     Patient will return to [facility] in [place].    TRANSITION/POST DISCHARGE CARE/PENDING TESTS/REFERRALS:     1. Patient will need follow up in infectious disease clinic 2 weeks after discharge.  2. Last day of IV antibiotics will be 01/12/17.  3. Labs to be faxed to number below.      CONDITION ON DISCHARGE:  A. Ambulation: Up with assistance only  B. Self-care Ability: Complete  C. Cognitive Status Alert and Oriented x 3  D. Code status at discharge:   Code Status Information     Code Status    Full Code             LINES/DRAINS/WOUNDS AT DISCHARGE:    PICC Double Lumen Left (Active)   Proximal Port Status intermittent infusion cap changed (saline/heplock) 12/05/2016  7:45 AM   Distal Port Status intermittent infusion cap changed (saline/heplock) 12/05/2016  7:45 AM   Type of Fluids Infusing none 12/05/2016  7:45 AM   Site Assessment WDL 12/05/2016  7:45 AM   Phlebitis Scale 0 12/05/2016  7:45 AM   Infiltration Scale 0 12/05/2016  7:45 AM   Dressing Type Transparent 12/05/2016  7:45 AM   Dressing Status Clean, Dry and Intact 12/05/2016  7:45 AM   Extremity Check Fingers;Pink 12/05/2016  7:45 AM   Evaluation of Need Hemodynamic monitoring 12/05/2016  7:45 AM   Daily Site Maintenance & Management Met 12/05/2016  7:45 AM   Dressing Change Interventions Met 12/05/2016  7:45 AM   Date Dressing Changed 12/02/16 12/05/2016  7:45 AM   Number of days:26       Foley Catheter (Active)   Catheter To Urometer 12/05/2016  7:45 AM   Catheter Secured Yes  12/05/2016  7:45 AM   Urine Color Clear;Yellow 12/05/2016  6:00 AM   Output 600 12/05/2016  6:00 AM   Number of days:5       Wound (Non-Surgical) Right;Upper Foot (Active)    Wound present on admission? Yes 11/30/2016  8:00 PM   Wound Characteristics Pressure 12/05/2016  7:45 AM   Periwound Dark Edges 12/05/2016  7:45 AM   Wound Undermining No 12/05/2016  7:45 AM   Wound Tunneling  No 12/05/2016  7:45 AM   Wound Drainage Amount None 12/05/2016  7:45 AM   Wound Odor Absent 12/05/2016  7:45 AM   Dressing Type 2X2 Gauze 12/02/2016  4:00 PM   Dressing Status N/A 12/05/2016  8:20 AM   Dressing Drainage Scant 12/05/2016  7:45 AM   Number of days:5       Colostomy Left;Lower (Active)   Peristomal Red 12/04/2016 11:35 PM   Stoma Assessment Red 12/05/2016  7:45 AM   Colostomy Output 800 mL 11/30/2016  1:00 AM   Output Description BR;SO;FO 12/05/2016  7:45 AM   Appliance Changed;Intact;Two piece - drainable 12/05/2016  7:45 AM   Number of days:86       DISCHARGE DISPOSITION:  Skilled Nursing Unit      DISCHARGE INSTRUCTIONS:  Follow-up Information     Follow up with Infectious Diseases Clinic, POC .    Specialty:  Infectious Diseases    Contact information:    [address]  [place] [address]  [phone]    Additional information:    For driving directions to the Infectious Disease Clinic located within the [facility] in [place], please call [phone]-[phone]-CARE [phone]). You may also visit our website at [email or web address]*Valet parking is available to patients at [place] outpatient clinics for free and tipping is not required.*Visitors to our main campus will Location manager as we are expanding to better serve you. We apologize for any inconvenience this may cause and appreciate your patience.             CBC/DIFF   Weekly. Every Monday. Fax Labs To: [phone].     BASIC METABOLIC PANEL   Weekly. Every Monday. Fax Labs To: [phone].     AST (SGOT)   Weekly. Every Monday. Fax  Labs To: [phone].     ALT (SGPT)   Weekly. Every Monday. Fax Labs To: [phone].     CREATINE KINASE (CK), TOTAL, SERUM   Weekly. Every Monday. Fax Labs To: [phone].     BILIRUBIN TOTAL   Weekly. Every Monday. Fax Labs To: [phone].     ALK PHOS (ALKALINE PHOSPHATASE)   Weekly. Every Monday. Fax Labs To: [phone].     C-REACTIVE PROTEIN(CRP),INFLAMMATION   Weekly. Every Monday. Fax  Labs To: [phone].     MISCELLANEOUS INSTRUCTIONS TO PATIENT   -Meticulous Hygiene to the involved area, keep all skin clean and dry  -Cleanse open wounds of the right and left trochanter and left sacrum with wound clenz BID with dressing changes and pat dry  -Pack the right and left trochanter and left sacrum loosely with kerlix dampened with quarter strength Dakins Solution and secure with a Mepilex sacral border dressing or ABD pads and tape BID  -Apply Bag Balm QID and PRN to all areas of pressure in a nickel thick layer  -Preventive Measures-turn and position patient Q2 hours and prn  -Limit sitting time in chair to 1 hour intervals with full body shifts every 15 minutes while up in chair. Please use offloading support surface in chair at all times when patient sitting in chair (patients chair)  -Rooke or waffle boots at all times when patient in bed or chair, float heels off bed and chair surface at all times  -Offload heels from bed with placement of pillow underneath calves     SCHEDULE FOLLOW-UP MED SPEC - INFECTIOUS DISEASE - PHYSICIAN OFFICE CENTER   Follow-up in: 2 WEEKS    Reason for visit: HOSPITAL DISCHARGE    Follow-up reason: decub ulcers on IV abx    Provider: Dr. Linna Caprice             Ridwaan Albeiruti, MD 12/08/2016 10:30  [facility]  Internal Medicine, PGY-3  Pager [phone]      I saw and examined the patient.  I reviewed the resident's note.  I agree with the findings and plan of care as documented in the resident's note.  Any exceptions/additions are  edited/noted.    Walker Kehr, MD    Copies sent to Care Team       Relationship Specialty Notifications Start End    Pcp, No PCP - General   12/01/16           Referring providers can utilize [email or web address] to access their referred [facility] patient's information.    Outpatient Antimicrobial Treatment Plan     Diagnosis Group:  Diagnosis:   Microorganisms:   Bone and joint infection MRSA bacteremia and chronic osteomyelitis History of multiple organisms including Acinetobacter, E coli, E faeciumand Morganella, VRE. This admission MRSA bacteremia        Antibiotics  IV Antibiotics:  Dose  Frequency Anticipated Stop date:  Comments:   Daptomycin 525m Every 24 hours  01/12/17    Cefepime 2gm Every 8 hours  01/12/17    Oral Antibiotics:  Dose Frequency Anticipated Stop date:  Comment:   Metronidazole 5055mThree times a day  01/12/17    6 weeks of therapy     Important drug interactions:      Allergies:          Labs monitoring plan/orders  Lab Lab Frequency Additional Comments    CBC/diff, BUN, Creatinine, AST, ALT, alk phos, total bilirubin, CRP and CK level   Every Monday        OPAT Pharmacist:  Dr. MeTobie PoetPhone Number: [phone] Labs To: [phone]   Physician Monitoring OPAT Course:   Dr. NaCharyl Danceroidad  Phone Number: [phone] Labs To: [place]     [place]ing [place]rehab          Physician to  be seen for follow-up:      Follow-Up Date:   Follow-Up Time:         Attending Physician    Boone Master, PharmD  Infectious Diseases Clinical Pharmacist  [phone]  [phone]

## 2016-12-05 NOTE — Progress Notes (Signed)
[facility]  Medicine Progress Note  Full Code    [name]  Date of service: 12/05/2016    Subjective:  No acute events overnight.  He dorsal some twitching of his upper extremities.  He denies any fevers or chills.  Still awaiting acceptance by [facility] [place].  Patient endorsed some shortness of breath to nursing.  However, he required no oxygen and appeared comfortable on room air.  Oxygen saturations in the high 90s.    Vital Signs:  Temp (24hrs) Max:38.1 C (100.6 F)      Systolic (24hrs), Avg:111 , Min:102 , Max:120     Diastolic (24hrs), Avg:57, Min:54, Max:60    Temp  Avg: 37.5 C (99.5 F)  Min: 36.8 C (98.2 F)  Max: 38.1 C (100.6 F)  Pulse  Avg: 113  Min: 107  Max: 118  Resp  Avg: 18.7  Min: 18  Max: 20  SpO2  Avg: 96.3 %  Min: 95 %  Max: 98 %  MAP (Non-Invasive)  Avg: 78.3 mmHG  Min: 72 mmHG  Max: 86 mmHG  Pain Score (Numeric, Faces): 8  Fi02    I/O:  I/O last 24 hours:      Intake/Output Summary (Last 24 hours) at 12/05/16 0932  Last data filed at 12/05/16 0600   Gross per 24 hour   Intake              360 ml   Output             5100 ml   Net            -4740 ml     I/O current shift:     Blood Sugars: Last Fingerstick:  No results found for: GLUCOSEPOC      Current Facility-Administered Medications:  acetaminophen (TYLENOL) tablet 650 mg Oral Q6HRS   ascorbic acid (VITAMIN C) tablet 500 mg Oral 2x/day   bacitracin zinc 500 units/gram topical ointment packet 1 Packet Topical 2x/day   baclofen (LIORESAL) tablet 20 mg Oral 3x/day   ceFEPime (MAXIPIME) 2 g in iso-osmotic 100 mL premix IVPB 2 g Intravenous Q8H   DAPTOmycin (CUBICIN) 550 mg in NS 100 mL IVPB 6 mg/kg Intravenous Q24H   escitalopram (LEXAPRO) tablet 20 mg Oral Daily   furosemide (LASIX) tablet 20 mg Oral QAM   furosemide (LASIX) tablet 10 mg Oral QPM   influenza virus vaccine (PF) IM injection (FLUARIX for ages 58 months and older) 0.5 mL Intramuscular Once   lanolin-oxyquin-pet, hydrophil (BAG BALM) topical  ointment  Apply Topically 4x/day   metroNIDAZOLE (FLAGYL) tablet 500 mg Oral Q8HRS   NS flush syringe 2 mL Intracatheter Q8HRS   And      NS flush syringe 2-6 mL Intracatheter Q1 MIN PRN   oxyCODONE (ROXICODONE) immediate release tablet 15 mg Oral Q4H PRN   Or      oxyCODONE (ROXICODONE) immediate release tablet 20 mg Oral Q4H PRN   pantoprazole (PROTONIX) delayed release tablet 40 mg Oral Daily   perflutren lipid microspheres (DEFINITY) 1.1 mg/mL injection 0.3 mL Intravenous Give in Cardiology   pregabalin (LYRICA) capsule 150 mg 150 mg Oral 2x/day   sennosides-docusate sodium (SENOKOT-S) 8.6-50mg  per tablet 1 Tab Oral 2x/day   sodium hypochlorite (DAKINS) 0.125% (quarter strength) irrigation  Irrigation Q12H   sodium hypochlorite 0.0125% (WOUNDCLENZ OTC) irrigation  Irrigation Q12H   zinc sulfate (ZINCATE) 220 mg (50 mg elemental zinc) tablet 220 mg Oral Daily  No Known Allergies    Physical Exam:  General: Appears chronically ill.  No acute distress.  Head: Normocephalic and atraumatic.  No obvious abnormality.  Eyes: Conjunctiva clear.  Pupils equal, round.  Extraocular eye movement intact.    Respiratory: Breathing non-labored. Clear to auscultation bilateral. No wheezes, rhonchi, or rales.  Cardiovascular: Tachycardic. Regular rhythm Normal S1/S2.   Gastrointestinal: Ostomy present. Soft, non-tender.  Bowel sounds normal. No hepatomegaly.  Extremities: No cyanosis or clubbing.  Skin: Bilateral stage IV sacral decubitus ulcers to bilateral hips. Stage IV coccyx ulcer.    Neurologic: A/O x3.  Paraplegia lower extremities.    Psychiatric: Fluent in speech and cognition.    Labs:  CBC   Recent Labs      12/02/16   1507  12/03/16   0630  12/03/16   1639  12/04/16   0530  12/05/16   0515   WBC   --   6.5   --   4.8  8.1   HGB  7.8*  7.1*  7.5*  9.8*  7.7*   HCT  24.7*  21.9*  24.0*  31.3*  24.3*   PLTCNT   --   389   --   352  448        BMP   Recent Labs      12/03/16   0542  12/04/16   0530  12/05/16   0515      SODIUM  143  136  135*   POTASSIUM  3.5  4.2  4.3   CHLORIDE  120*  112*  109   CO2  17*  19*  20*   BUN  CREATININE  0.71  0.85  0.79   GLUCOSENF  63*  98  94   ANIONGAP  BUNCRRATIO  GFR  >59  >59  >59         Radiology:  No new imaging    Microbiology:   Blood cultures no growth to date since 9/24. MRSA from blood cultures on 9/22.    PT/OT: Yes    Consults:  Plastic surgery, pain management    Hardware (lines, foley's, tubes):  Peripheral IV lines, colostomy, PICC, foley catheter    Assessment/ Plan:   Active Hospital Problems    Diagnosis    Primary Problem: Sepsis (CMS [facility])    Hepatitis C antibody positive in blood    Drug-seeking behavior    Decubitus ulcer     36 year old male with a history of extensive decubitus ulcers, presented with MRSA sepsis and osteomyelitis.    MRSA Sepsis  Osteomyelitis and sacral decubitus stage 4 ulcers, bilateral  -Daptomycin/Cefepime/flagyl continued. (6 week course from last negative blood culture puts his last day of abx on 01/12/17). OPAT consulted.  - refuses to have his PICC line removed. Refuses to have foley removed.  -Plastic surgery team consulted, patient is not a surgical candidate for a flap procedure  -Patient is s/p ileostomy for decubitus ulcers  -CRP downtrending  -PRN Roxicodone for pain control (15 mg Q4H prn and 20 mg Q4H prn)  -Bowel regimen ordered with Miralax and Senokot.  Last BM 9/27  -Decubitus ulcer precautions, frequent repositioning  -Blood cultures positive for MRSA; repeat blood cultures NGTD. Last positive culture on 9/24.  -TTE and TEE completed with no valve vegetations  - continue 20 mg lasix in AM and  10 mg lasix in evening for leg edema    Anemia, acute blood loss  - Acute blood loss anemia secondary to decubitus ulcers on sacrum vs GI bleed  - hemoglobin 7.7 this morning.  Yesterday's 9.8 most likely a false read.  His 7.7 hemoglobin today is improved from the days prior.  - protonix 40 mg twice  daily  - Transfuse as needed below 7  - will give her another day before restarting Lovenox, so long as his hemoglobin is stable.  - monitor output from ostomy    Drug-seeking behavior   -Pain management team consulted  -Will manage pain with oral opiates    Status post ileostomy  -Ileostomy site healthy  -Ostomy consult     Severe protein- calorie malnutrition  -Albumin 1.5  -Pre albumin ordered  -Likely contributing to poor wound healing  -MNT protocol and nutrition consult    Hypomagnesemia, improved    Hypocalcemia, resolved    Hepatitis C   -Hep C screen positive  -HIV screen negative  -Patient reports that he has not been treated for hepatitis-C in the past  -Will establish with infectious disease clinic on discharge    DVT/PE Prophylaxis:  Will consider restarting Lovenox tomorrow if hemoglobin remains stable.  Will hold today for hemoglobin of 7.7.  I suspect yesterday's 9.8 read is falsely elevated.    Disposition Planning: [facility] [place] plans to take patient back.    Ellis Parents, MD 12/05/2016 09:32  [facility]  Internal Medicine, PGY-3  Pager [phone]      I saw and examined the patient.  I reviewed the resident's note.  I agree with the findings and plan of care as documented in the resident's note.  Any exceptions/additions are edited/noted.    Noel Christmas, MD

## 2016-12-05 NOTE — Nurses Notes (Signed)
Results for [name], [name] (MRN [id number]) as of 12/05/2016 05:33   Ref. Range 12/05/2016 05:15   HGB Latest Ref Range: 12.5 - 16.3 g/dL 7.7 (L)     Abdul Mannan notified of Hgb.  Awaiting orders.  Will continue to monitor.

## 2016-12-06 LAB — ADULT ROUTINE BLOOD CULTURE, SET OF 2 BOTTLES (BACTERIA AND YEAST)
BLOOD CULTURE, ROUTINE: ABNORMAL — CR
BLOOD CULTURE, ROUTINE: NO GROWTH
BLOOD CULTURE, ROUTINE: NO GROWTH

## 2016-12-06 LAB — THYROID STIMULATING HORMONE WITH FREE T4 REFLEX: TSH: 0.58 u[IU]/mL (ref 0.350–5.000)

## 2016-12-06 LAB — H & H
HCT: 24.7 % — ABNORMAL LOW (ref 36.7–47.0)
HGB: 8 g/dL — ABNORMAL LOW (ref 12.5–16.3)

## 2016-12-06 MED ORDER — ENOXAPARIN 40 MG/0.4 ML SUBCUTANEOUS SYRINGE
40.0000 mg | INJECTION | SUBCUTANEOUS | Status: DC
Start: 2016-12-06 — End: 2016-12-08
  Administered 2016-12-06 – 2016-12-07 (×2): 40 mg via SUBCUTANEOUS
  Filled 2016-12-06: qty 0.4
  Filled 2016-12-06 (×2): qty 0

## 2016-12-06 MED ADMIN — sodium hypochlorite 0.125 % solution: @ 06:00:00

## 2016-12-06 MED ADMIN — ascorbic acid (vitamin C) 500 mg tablet: ORAL | @ 20:00:00

## 2016-12-06 MED ADMIN — oxytocin 20 unit/1000 mL in 0.9 % sodium chloride intravenous: INTRAVENOUS | @ 05:00:00

## 2016-12-06 MED ADMIN — albumin, human 5 % intravenous solution: ORAL | @ 09:00:00 | NDC 00944049101

## 2016-12-06 MED ADMIN — lidocaine 2 % mucosal jelly in applicator: ORAL | @ 13:00:00 | NDC 76329301305

## 2016-12-06 MED ADMIN — sodium chloride 0.9 % (flush) injection syringe: @ 20:00:00

## 2016-12-06 NOTE — Progress Notes (Signed)
[facility]  Medicine Progress Note  Full Code    [name]  Date of service: 12/06/2016    Subjective:   Patient is sitting up in bed eating four sausage and egg breakfast sandwiches with butter and jam on top.   No acute events overnight.  Denies any fevers or chills.  Plan to be discharged on Monday morning to [facility] in [place].    Vital Signs:  Temp (24hrs) Max:37.9 C (100.3 F)      Systolic (24hrs), Avg:112 , Min:81 , Max:126     Diastolic (24hrs), Avg:64, Min:55, Max:75    Temp  Avg: 37.3 C (99.1 F)  Min: 36.8 C (98.2 F)  Max: 37.9 C (100.3 F)  Pulse  Avg: 97.2  Min: 89  Max: 103  Resp  Avg: 18  Min: 18  Max: 18  SpO2  Avg: 97.6 %  Min: 96 %  Max: 98 %  MAP (Non-Invasive)  Avg: 85.3 mmHG  Min: 81 mmHG  Max: 90 mmHG  Pain Score (Numeric, Faces): 7  Fi02    I/O:  I/O last 24 hours:      Intake/Output Summary (Last 24 hours) at 12/06/16 1424  Last data filed at 12/06/16 0956   Gross per 24 hour   Intake             1055 ml   Output             4450 ml   Net            -3395 ml     I/O current shift:  09/29 0800 - 09/29 1559  In: 240 [P.O.:240]  Out: 900 [Urine:900]  Blood Sugars: Last Fingerstick:  No results found for: GLUCOSEPOC      Current Facility-Administered Medications:  acetaminophen (TYLENOL) tablet 650 mg Oral Q6HRS   ascorbic acid (VITAMIN C) tablet 500 mg Oral 2x/day   bacitracin zinc 500 units/gram topical ointment packet 1 Packet Topical 2x/day   baclofen (LIORESAL) tablet 20 mg Oral 3x/day   ceFEPime (MAXIPIME) 2 g in iso-osmotic 100 mL premix IVPB 2 g Intravenous Q8H   DAPTOmycin (CUBICIN) 550 mg in NS 100 mL IVPB 6 mg/kg Intravenous Q24H   escitalopram (LEXAPRO) tablet 20 mg Oral Daily   furosemide (LASIX) tablet 20 mg Oral QAM   furosemide (LASIX) tablet 10 mg Oral QPM   influenza virus vaccine (PF) IM injection (FLUARIX for ages 62 months and older) 0.5 mL Intramuscular Once   lanolin-oxyquin-pet, hydrophil (BAG BALM) topical ointment  Apply Topically 4x/day    metroNIDAZOLE (FLAGYL) tablet 500 mg Oral Q8HRS   NS flush syringe 2 mL Intracatheter Q8HRS   And      NS flush syringe 2-6 mL Intracatheter Q1 MIN PRN   oxyCODONE (ROXICODONE) immediate release tablet 15 mg Oral Q4H PRN   Or      oxyCODONE (ROXICODONE) immediate release tablet 20 mg Oral Q4H PRN   pantoprazole (PROTONIX) delayed release tablet 40 mg Oral Daily   perflutren lipid microspheres (DEFINITY) 1.1 mg/mL injection 0.3 mL Intravenous Give in Cardiology   pregabalin (LYRICA) capsule 150 mg 150 mg Oral 2x/day   sennosides-docusate sodium (SENOKOT-S) 8.6-50mg  per tablet 1 Tab Oral 2x/day   sodium hypochlorite (DAKINS) 0.125% (quarter strength) irrigation  Irrigation Q12H   sodium hypochlorite 0.0125% (WOUNDCLENZ OTC) irrigation  Irrigation Q12H   zinc sulfate (ZINCATE) 220 mg (50 mg elemental zinc) tablet 220 mg Oral Daily       No  Known Allergies    Physical Exam:  General: Appears chronically ill.  No acute distress.  Head: Normocephalic and atraumatic.  No obvious abnormality.  Eyes: Conjunctiva clear.  Pupils equal, round.  Extraocular eye movement intact.    Respiratory: Breathing non-labored. Clear to auscultation bilateral. No wheezes, rhonchi, or rales.  Cardiovascular: Tachycardic. Regular rhythm Normal S1/S2.   Gastrointestinal: Ostomy present. Soft, non-tender.  Bowel sounds normal. No hepatomegaly.  Extremities: No cyanosis or clubbing.  Skin: Bilateral stage IV sacral decubitus ulcers to bilateral hips. Stage IV coccyx ulcer.    Neurologic: A/O x3.  Paraplegia lower extremities.      Labs:  CBC   Recent Labs      12/03/16   1639  12/04/16   0530  12/05/16   0515  12/06/16   0515   WBC   --   4.8  8.1   --    HGB  7.5*  9.8*  7.7*  8.0*   HCT  24.0*  31.3*  24.3*  24.7*   PLTCNT   --   352  448   --         BMP   Recent Labs      12/04/16   0530  12/05/16   0515   SODIUM  136  135*   POTASSIUM  4.2  4.3   CHLORIDE  112*  109   CO2  19*  20*   BUN  18  15   CREATININE  0.85  0.79   GLUCOSENF  98  94    ANIONGAP  5  6   BUNCRRATIO  21  19   GFR  >59  >59         Radiology:  No new imaging    Microbiology:   Blood cultures no growth to date since 9/24. MRSA from blood cultures on 9/22.    PT/OT: Yes    Consults:  Plastic surgery, pain management    Hardware (lines, foley's, tubes):  Peripheral IV lines, colostomy, PICC, foley catheter    Assessment/ Plan:   Active Hospital Problems    Diagnosis   . Primary Problem: Sepsis (CMS HCC)   . Hepatitis C antibody positive in blood   . Drug-seeking behavior   . Decubitus ulcer     36 year old male with a history of extensive decubitus ulcers, presented with MRSA sepsis and osteomyelitis.    MRSA Sepsis  Osteomyelitis and sacral decubitus stage 4 ulcers, bilateral  -Daptomycin/Cefepime/flagyl continued. (6 week course from last negative blood culture puts his last day of abx on 01/12/17). OPAT consulted.  - refuses to have his PICC line removed. Refuses to have foley removed.  -Plastic surgery team consulted, patient is not a surgical candidate for a flap procedure  -Patient is s/p ileostomy for decubitus ulcers  -PRN Roxicodone for pain control (15 mg Q4H prn and 20 mg Q4H prn)  -Bowel regimen ordered with Miralax and Senokot.  Last BM 9/28  -Decubitus ulcer precautions, frequent repositioning  -Blood cultures positive for MRSA; repeat blood cultures NGTD. Last positive culture on 9/24.  -TTE and TEE completed with no valve vegetations  - continue 20 mg lasix in AM and 10 mg lasix in evening for leg edema    Anemia, acute blood loss  - Acute blood loss anemia secondary to decubitus ulcers on sacrum vs GI bleed  - hemoglobin 8.0 and stable  - protonix 40 mg twice daily  - Transfuse as needed below 7  -  will restart lovenox as Hgb is stable  - monitor output from ostomy    Drug-seeking behavior   -Pain management team consulted  -Will manage pain with oral opiates    Status post ileostomy  -Ileostomy site healthy  -Ostomy consult     Severe protein- calorie malnutrition   -Albumin 1.5; double portion food ordered. Patient has ravenous appetite. Will order TSH to rule out hyperthyroidism.  -Pre albumin ordered  -Likely contributing to poor wound healing  -MNT protocol and nutrition consult    Hypomagnesemia, improved    Hypocalcemia, resolved    Hepatitis C   -Hep C screen positive  -HIV screen negative  -Patient reports that he has not been treated for hepatitis-C in the past  -Will establish with infectious disease clinic on discharge    DVT/PE Prophylaxis: lovenox restarted today as Hgb stable    Disposition Planning: [facility] in [place] plans to take patient back Monday morning.    Ellis Parents, MD 12/06/2016 14:24  [facility]  Internal Medicine, PGY-3  Pager [phone]      I saw and examined the patient.  I reviewed the resident's note.  I agree with the findings and plan of care as documented in the resident's note.  Any exceptions/additions are edited/noted.    Noel Christmas, MD

## 2016-12-06 NOTE — Care Plan (Signed)
Problem: Infection, Risk/Actual (Adult)  Goal: Infection Prevention/Resolution  Patient will demonstrate the desired outcomes by discharge/transition of care.   Outcome: Ongoing (see interventions/notes)      Problem: Mobility, Physical Impaired (Adult)  Goal: Enhanced Mobility Skills  Patient will demonstrate the desired outcomes by discharge/transition of care.   Outcome: Ongoing (see interventions/notes)    Goal: Enhanced Functional Ability  Patient will demonstrate the desired outcomes by discharge/transition of care.   Outcome: Ongoing (see interventions/notes)      Problem: Health Knowledge, Opportunity to Enhance (Adult,Obstetrics,Pediatric)  Goal: Knowledgeable about Health Subject/Topic  Patient will demonstrate the desired outcomes by discharge/transition of care.   Outcome: Ongoing (see interventions/notes)      Problem: Self-Care Deficit (Adult,Obstetrics,Pediatric)  Goal: Improved Ability to Perform BADL and IADL  Patient will demonstrate the desired outcomes by discharge/transition of care.   Outcome: Ongoing (see interventions/notes)      Problem: Wound (Includes Pressure Injury) (Adult)  Prevent and manage potential problems including:  1. bleeding  2. delayed wound healing  3. infection  4. pain  5. situational response  6. skin injury, new onset   Goal: Signs and Symptoms of Listed Potential Problems Will be Absent, Minimized or Managed (Wound)  Signs and symptoms of listed potential problems will be absent, minimized or managed by discharge/transition of care (reference Wound (Includes Pressure Injury) (Adult) CPG).   Outcome: Ongoing (see interventions/notes)      Problem: Depression (Adult,Obstetrics,Pediatric)  Goal: Establish/Maintain Self-Care Routine  Patient will demonstrate the desired outcomes by discharge/transition of care.   Outcome: Ongoing (see interventions/notes)    Goal: Improved/Stable Mood  Patient will demonstrate the desired outcomes by discharge/transition of care.   Outcome:  Ongoing (see interventions/notes)      Problem: Skin Injury Risk (Adult,Obstetrics,Pediatric)  Goal: Skin Health and Integrity  Patient will demonstrate the desired outcomes by discharge/transition of care.   Outcome: Ongoing (see interventions/notes)      Problem: Behavioral Regulation Impairment (Disruptive Behavior) (Adult)  Goal: Improved Impulse/Aggression Control (Disruptive Behavior)  Outcome: Outcome Achieved Date Met: 12/06/16      Problem: Patient Care Overview (Adult,OB)  Goal: Plan of Care Review(Adult,OB)  The patient and/or their representative will communicate an understanding of their plan of care   Outcome: Ongoing (see interventions/notes)  Plan of care reviewed with patient. Patient encouraged to move in bed and does so a lot with his trapeze bed. Dressings changed as ordered. Some complaints of pain today but treated with PRN medications. Independence encouraged. No other complaints at this time. Plan is for patient to be discharged on Monday. Vitals stable.    Problem: Fall Risk (Adult)  Goal: Absence of Falls  Patient will demonstrate the desired outcomes by discharge/transition of care.   Outcome: Ongoing (see interventions/notes)

## 2016-12-06 NOTE — Care Plan (Signed)
Problem: Infection, Risk/Actual (Adult)  Goal: Infection Prevention/Resolution  Patient will demonstrate the desired outcomes by discharge/transition of care.   Outcome: Ongoing (see interventions/notes)   12/06/16 0303   Infection, Risk/Actual (Adult)   Infection Prevention/Resolution making progress toward outcome       Problem: Mobility, Physical Impaired (Adult)  Goal: Enhanced Mobility Skills  Patient will demonstrate the desired outcomes by discharge/transition of care.   Outcome: Ongoing (see interventions/notes)   12/06/16 0303   Mobility, Physical Impaired (Adult)   Enhanced Mobility Skills making progress toward outcome     Goal: Enhanced Functional Ability  Patient will demonstrate the desired outcomes by discharge/transition of care.   Outcome: Ongoing (see interventions/notes)   12/06/16 0303   Mobility, Physical Impaired (Adult)   Enhanced Functional Ability making progress toward outcome       Problem: Health Knowledge, Opportunity to Enhance (Adult,Obstetrics,Pediatric)  Goal: Knowledgeable about Health Subject/Topic  Patient will demonstrate the desired outcomes by discharge/transition of care.   Outcome: Ongoing (see interventions/notes)   12/06/16 0303   Health Knowledge, Opportunity to Enhance (Adult,Obstetrics,Pediatric)   Knowledgeable about Health Subject/Topic making progress toward outcome       Problem: Self-Care Deficit (Adult,Obstetrics,Pediatric)  Goal: Improved Ability to Perform BADL and IADL  Patient will demonstrate the desired outcomes by discharge/transition of care.   Outcome: Ongoing (see interventions/notes)   12/06/16 0303   Self-Care Deficit (Adult,Obstetrics,Pediatric)   Improved Ability to Perform BADL and IADL making progress toward outcome       Problem: Wound (Includes Pressure Injury) (Adult)  Prevent and manage potential problems including:  1. bleeding  2. delayed wound healing  3. infection  4. pain  5. situational response  6. skin injury, new onset   Goal: Signs and  Symptoms of Listed Potential Problems Will be Absent, Minimized or Managed (Wound)  Signs and symptoms of listed potential problems will be absent, minimized or managed by discharge/transition of care (reference Wound (Includes Pressure Injury) (Adult) CPG).   Outcome: Ongoing (see interventions/notes)   12/03/16 2327 12/05/16 0336   Wound (Includes Pressure Injury)   Problems Assessed (Wound) all --    Problems Present (Wound) --  bleeding;delayed wound healing;infection;pain       Problem: Depression (Adult,Obstetrics,Pediatric)  Goal: Establish/Maintain Self-Care Routine  Patient will demonstrate the desired outcomes by discharge/transition of care.   Outcome: Ongoing (see interventions/notes)   12/06/16 0303   Depression (Adult,Obstetrics,Pediatric)   Establish/Maintain Self-Care Routine making progress toward outcome     Goal: Improved/Stable Mood  Patient will demonstrate the desired outcomes by discharge/transition of care.   Outcome: Ongoing (see interventions/notes)   12/06/16 0303   Depression (Adult,Obstetrics,Pediatric)   Improved/Stable Mood making progress toward outcome       Problem: Skin Injury Risk (Adult,Obstetrics,Pediatric)  Goal: Skin Health and Integrity  Patient will demonstrate the desired outcomes by discharge/transition of care.   Outcome: Ongoing (see interventions/notes)   12/06/16 0303   Skin Injury Risk (Adult,Obstetrics,Pediatric)   Skin Health and Integrity making progress toward outcome       Problem: Patient Care Overview (Adult,OB)  Goal: Plan of Care Review(Adult,OB)  The patient and/or their representative will communicate an understanding of their plan of care   Outcome: Ongoing (see interventions/notes)  Care plan reviewed with patient.  Patient admitted with sepsis and large pressure wounds to sacrum/hips.  TEE completed.  Colostomy in place to LUQ, replaced and CDI with formed brown stool.  Foley in place draining clear yellow urine and patient refusing  to D/C do to  incontinence.  IVABX continued.  Patient refusing removal of L PICC line.  Denies any pain.  Patient encouraged to reposition frequently in bed and verbalized understanding.  Refused repositioning help from staff members.  Refused bath during shift but complete linens changed.  Wound dressing changed to sacrum/hips completed during shift per kardex orders, continued twice a day.  Will continue to monitor labs and VS.  Goal: Interdisciplinary Rounds/Family Conf  Outcome: Ongoing (see interventions/notes)   12/06/16 0303   Interdisciplinary Rounds/Family Conf   Participants nursing;patient       Problem: Fall Risk (Adult)  Goal: Absence of Falls  Patient will demonstrate the desired outcomes by discharge/transition of care.   Outcome: Ongoing (see interventions/notes)   12/06/16 0303   Fall Risk (Adult)   Absence of Falls making progress toward outcome

## 2016-12-07 LAB — URINALYSIS, MACROSCOPIC
BILIRUBIN: NEGATIVE mg/dL
BLOOD: NEGATIVE mg/dL
COLOR: NORMAL
GLUCOSE: NEGATIVE mg/dL
KETONES: NEGATIVE mg/dL
NITRITE: NEGATIVE
PH: 6 (ref 5.0–8.0)
PROTEIN: 30 mg/dL — AB
SPECIFIC GRAVITY: 1.015 (ref 1.005–1.030)
UROBILINOGEN: NEGATIVE mg/dL

## 2016-12-07 LAB — URINALYSIS, MICROSCOPIC
RBCS: 1 /HPF (ref <6.0)
WBCS: 8 /HPF — ABNORMAL HIGH (ref <4.0)

## 2016-12-07 MED ORDER — ONDANSETRON HCL (PF) 4 MG/2 ML INJECTION SOLUTION
4.00 mg | Freq: Three times a day (TID) | INTRAMUSCULAR | Status: DC | PRN
Start: 2016-12-07 — End: 2016-12-08
  Administered 2016-12-07 – 2016-12-08 (×4): 4 mg via INTRAVENOUS
  Filled 2016-12-07 (×4): qty 2

## 2016-12-07 MED ADMIN — sennosides 8.6 mg-docusate sodium 50 mg tablet: ORAL | @ 08:00:00

## 2016-12-07 MED ADMIN — nystatin 100,000 unit/gram topical cream: INTRAVENOUS | @ 03:00:00 | NDC 00168005415

## 2016-12-07 MED ADMIN — cefepime 2 gram/100 mL in dextrose (iso-osmotic) intravenous piggyback: INTRAVENOUS | @ 20:00:00

## 2016-12-07 MED ADMIN — sodium chloride 0.9 % irrigation solution: @ 05:00:00

## 2016-12-07 MED ADMIN — SODIUM CHLORIDE 0.9 % W/ ADDITIVES: ORAL | @ 22:00:00 | NDC 00338004904

## 2016-12-07 MED ADMIN — sodium chloride 0.9 % (flush) injection syringe: INTRAVENOUS | @ 08:00:00

## 2016-12-07 NOTE — Nurses Notes (Signed)
Patient refusing to have vitals taken or wear monitor leads for telemonitoring,  Romana Juniper, RN  12/07/2016, 16:35

## 2016-12-07 NOTE — Care Plan (Signed)
Problem: Infection, Risk/Actual (Adult)  Goal: Infection Prevention/Resolution  Patient will demonstrate the desired outcomes by discharge/transition of care.   Outcome: Ongoing (see interventions/notes)      Problem: Mobility, Physical Impaired (Adult)  Goal: Enhanced Mobility Skills  Patient will demonstrate the desired outcomes by discharge/transition of care.   Outcome: Ongoing (see interventions/notes)    Goal: Enhanced Functional Ability  Patient will demonstrate the desired outcomes by discharge/transition of care.   Outcome: Ongoing (see interventions/notes)      Problem: Health Knowledge, Opportunity to Enhance (Adult,Obstetrics,Pediatric)  Goal: Knowledgeable about Health Subject/Topic  Patient will demonstrate the desired outcomes by discharge/transition of care.   Outcome: Ongoing (see interventions/notes)      Problem: Self-Care Deficit (Adult,Obstetrics,Pediatric)  Goal: Improved Ability to Perform BADL and IADL  Patient will demonstrate the desired outcomes by discharge/transition of care.   Outcome: Ongoing (see interventions/notes)

## 2016-12-07 NOTE — Care Plan (Signed)
Problem: Infection, Risk/Actual (Adult)  Goal: Infection Prevention/Resolution  Patient will demonstrate the desired outcomes by discharge/transition of care.   Outcome: Ongoing (see interventions/notes)   12/07/16 0212   Infection, Risk/Actual (Adult)   Infection Prevention/Resolution making progress toward outcome       Problem: Mobility, Physical Impaired (Adult)  Goal: Enhanced Mobility Skills  Patient will demonstrate the desired outcomes by discharge/transition of care.   Outcome: Ongoing (see interventions/notes)   12/07/16 0212   Mobility, Physical Impaired (Adult)   Enhanced Mobility Skills making progress toward outcome     Goal: Enhanced Functional Ability  Patient will demonstrate the desired outcomes by discharge/transition of care.   Outcome: Ongoing (see interventions/notes)   12/07/16 0212   Mobility, Physical Impaired (Adult)   Enhanced Functional Ability making progress toward outcome       Problem: Health Knowledge, Opportunity to Enhance (Adult,Obstetrics,Pediatric)  Goal: Knowledgeable about Health Subject/Topic  Patient will demonstrate the desired outcomes by discharge/transition of care.   Outcome: Ongoing (see interventions/notes)   12/07/16 0212   Health Knowledge, Opportunity to Enhance (Adult,Obstetrics,Pediatric)   Knowledgeable about Health Subject/Topic making progress toward outcome       Problem: Self-Care Deficit (Adult,Obstetrics,Pediatric)  Goal: Improved Ability to Perform BADL and IADL  Patient will demonstrate the desired outcomes by discharge/transition of care.   Outcome: Ongoing (see interventions/notes)   12/07/16 0212   Self-Care Deficit (Adult,Obstetrics,Pediatric)   Improved Ability to Perform BADL and IADL making progress toward outcome       Problem: Wound (Includes Pressure Injury) (Adult)  Prevent and manage potential problems including:  1. bleeding  2. delayed wound healing  3. infection  4. pain  5. situational response  6. skin injury, new onset   Goal: Signs and  Symptoms of Listed Potential Problems Will be Absent, Minimized or Managed (Wound)  Signs and symptoms of listed potential problems will be absent, minimized or managed by discharge/transition of care (reference Wound (Includes Pressure Injury) (Adult) CPG).   Outcome: Ongoing (see interventions/notes)   12/07/16 0212   Wound (Includes Pressure Injury)   Problems Assessed (Wound) all   Problems Present (Wound) delayed wound healing;infection;pain;situational response       Problem: Depression (Adult,Obstetrics,Pediatric)  Goal: Establish/Maintain Self-Care Routine  Patient will demonstrate the desired outcomes by discharge/transition of care.   Outcome: Ongoing (see interventions/notes)   12/07/16 0212   Depression (Adult,Obstetrics,Pediatric)   Establish/Maintain Self-Care Routine making progress toward outcome     Goal: Improved/Stable Mood  Patient will demonstrate the desired outcomes by discharge/transition of care.   Outcome: Ongoing (see interventions/notes)   12/07/16 0212   Depression (Adult,Obstetrics,Pediatric)   Improved/Stable Mood making progress toward outcome       Problem: Skin Injury Risk (Adult,Obstetrics,Pediatric)  Goal: Skin Health and Integrity  Patient will demonstrate the desired outcomes by discharge/transition of care.   Outcome: Ongoing (see interventions/notes)   12/07/16 0212   Skin Injury Risk (Adult,Obstetrics,Pediatric)   Skin Health and Integrity making progress toward outcome       Problem: Social/Occupational/Functional Impairment (Disruptive Behavior) (Adult)  Goal: Improved Social/Occupational/Functional Skills (Disruptive Behavior)  Outcome: Ongoing (see interventions/notes)   12/07/16 0212   Improved Social/Occupational/Functional Skills (Disruptive Behavior)   Improved Social/Occupational/Functional Skills Action Step (STG) Outcome making progress toward outcome       Problem: Patient Care Overview (Adult,OB)  Goal: Plan of Care Review(Adult,OB)  The patient and/or their  representative will communicate an understanding of their plan of care   Outcome: Ongoing (see interventions/notes)  Maintained on high fall precaution, placed call light and personal items within easy reach. Active listening utilized and encouraged to verbalized feelings. Assisted to turned to sides every 2 hours. Performed wound care and changed the dressings on the wound sites. Maintained on contact-MRSA precaution. Assisted in cleaning and changing the ostomy bag appliance while promoting independence. Continuously assessed for pain and PRN meds given as necessary. Hourly rounds for safety was observed. Will continue to monitor.      12/07/16 0212   Coping/Psychosocial   Plan Of Care Reviewed With patient     Goal: Individualization/Patient Specific Goal(Adult/OB)  Outcome: Ongoing (see interventions/notes)    Goal: Interdisciplinary Rounds/Family Conf  Outcome: Ongoing (see interventions/notes)      Problem: Fall Risk (Adult)  Goal: Absence of Falls  Patient will demonstrate the desired outcomes by discharge/transition of care.   Outcome: Ongoing (see interventions/notes)   12/07/16 0212   Fall Risk (Adult)   Absence of Falls making progress toward outcome

## 2016-12-07 NOTE — Nurses Notes (Signed)
Patient C/O NAUSEA  of Zofran administered  Romana Juniper, RN  12/07/2016, 10:38

## 2016-12-07 NOTE — Nurses Notes (Signed)
Notified service of patient c/o abdominal pain, nausea and vomiting. Advised by service to monitor emesis output and characteristics.   Romana Juniper, RN  12/07/2016, 11:01

## 2016-12-07 NOTE — Progress Notes (Signed)
[facility]  Medicine Progress Note  Full Code    [name]  Date of service: 12/07/2016    Subjective:   After eating a lot of food yesterday, he felt nauseated overnight and has had some clear emesis this morning.  He did not eat very much this morning.  Denies any fevers or chills.  He does endorse some left upper abdominal pain, which is right above his ostomy site.  His ostomy is still putting out stool.    Vital Signs:  Temp (24hrs) Max:37.9 C (100.2 F)      Systolic (24hrs), Avg:127 , Min:115 , Max:135     Diastolic (24hrs), Avg:67, Min:60, Max:77    Temp  Avg: 37.3 C (99.1 F)  Min: 36.8 C (98.2 F)  Max: 37.9 C (100.2 F)  Pulse  Avg: 100.1  Min: 83  Max: 120  Resp  Avg: 18.7  Min: 18  Max: 20  SpO2  Avg: 97.4 %  Min: 96 %  Max: 99 %  MAP (Non-Invasive)  Avg: 92 mmHG  Min: 92 mmHG  Max: 92 mmHG  Pain Score (Numeric, Faces): 9  Fi02    I/O:  I/O last 24 hours:      Intake/Output Summary (Last 24 hours) at 12/07/16 1137  Last data filed at 12/07/16 1100   Gross per 24 hour   Intake           1476.6 ml   Output             5975 ml   Net          -4498.4 ml     I/O current shift:  09/30 0800 - 09/30 1559  In: -   Out: 2050 [Urine:1250; Emesis:800]      Current Facility-Administered Medications:  acetaminophen (TYLENOL) tablet 650 mg Oral Q6HRS   ascorbic acid (VITAMIN C) tablet 500 mg Oral 2x/day   bacitracin zinc 500 units/gram topical ointment packet 1 Packet Topical 2x/day   baclofen (LIORESAL) tablet 20 mg Oral 3x/day   ceFEPime (MAXIPIME) 2 g in iso-osmotic 100 mL premix IVPB 2 g Intravenous Q8H   DAPTOmycin (CUBICIN) 550 mg in NS 100 mL IVPB 6 mg/kg Intravenous Q24H   enoxaparin PF (LOVENOX) 40 mg/0.4 mL SubQ injection 40 mg Subcutaneous Q24H   escitalopram (LEXAPRO) tablet 20 mg Oral Daily   furosemide (LASIX) tablet 20 mg Oral QAM   furosemide (LASIX) tablet 10 mg Oral QPM   influenza virus vaccine (PF) IM injection (FLUARIX for ages 46 months and older) 0.5 mL Intramuscular Once    lanolin-oxyquin-pet, hydrophil (BAG BALM) topical ointment  Apply Topically 4x/day   metroNIDAZOLE (FLAGYL) tablet 500 mg Oral Q8HRS   NS flush syringe 2 mL Intracatheter Q8HRS   And      NS flush syringe 2-6 mL Intracatheter Q1 MIN PRN   ondansetron (ZOFRAN) 2 mg/mL injection 4 mg Intravenous Q8H PRN   oxyCODONE (ROXICODONE) immediate release tablet 15 mg Oral Q4H PRN   Or      oxyCODONE (ROXICODONE) immediate release tablet 20 mg Oral Q4H PRN   pantoprazole (PROTONIX) delayed release tablet 40 mg Oral Daily   perflutren lipid microspheres (DEFINITY) 1.1 mg/mL injection 0.3 mL Intravenous Give in Cardiology   pregabalin (LYRICA) capsule 150 mg 150 mg Oral 2x/day   sennosides-docusate sodium (SENOKOT-S) 8.6-50mg  per tablet 1 Tab Oral 2x/day   sodium hypochlorite (DAKINS) 0.125% (quarter strength) irrigation  Irrigation Q12H   sodium hypochlorite 0.0125% (WOUNDCLENZ OTC) irrigation  Irrigation Q12H   zinc sulfate (ZINCATE) 220 mg (50 mg elemental zinc) tablet 220 mg Oral Daily       No Known Allergies    Physical Exam:  General: Appears chronically ill.  No acute distress.  Head: Normocephalic and atraumatic.  No obvious abnormality.  Eyes: Conjunctiva clear.  Pupils equal, round.  Extraocular eye movement intact.    Respiratory: Breathing non-labored. Clear to auscultation bilateral. No wheezes, rhonchi, or rales.  Cardiovascular: Tachycardic. Regular rhythm Normal S1/S2.   Gastrointestinal: Ostomy present. Mildly tender to palpation.  Bowel sounds normal. No hepatomegaly.  Extremities: No cyanosis or clubbing.  Skin: Bilateral stage IV sacral decubitus ulcers to bilateral hips. Stage IV coccyx ulcer.    Neurologic: A/O x3.  Paraplegia lower extremities.      Labs:  CBC   Recent Labs      12/05/16   0515  12/06/16   0515   WBC  8.1   --    HGB  7.7*  8.0*   HCT  24.3*  24.7*   PLTCNT  448   --         BMP   Recent Labs      12/05/16   0515   SODIUM  135*   POTASSIUM  4.3   CHLORIDE  109   CO2  20*   BUN  15    CREATININE  0.79   GLUCOSENF  94   ANIONGAP  6   BUNCRRATIO  19   GFR  >59         Radiology:  No new imaging    Microbiology:   Blood cultures no growth to date since 9/24. MRSA from blood cultures on 9/22.    PT/OT: Yes    Consults:  Plastic surgery, pain management    Hardware (lines, foley's, tubes):  Peripheral IV lines, colostomy, PICC, foley catheter    Assessment/ Plan:   Active Hospital Problems    Diagnosis   . Primary Problem: Sepsis (CMS HCC)   . Hepatitis C antibody positive in blood   . Drug-seeking behavior   . Decubitus ulcer     36 year old male with a history of extensive decubitus ulcers, presented with MRSA sepsis and osteomyelitis.    MRSA Sepsis  Osteomyelitis and sacral decubitus stage 4 ulcers, bilateral  -Daptomycin/Cefepime/flagyl continued. (6 week course from last negative blood culture puts his last day of abx on 01/12/17). OPAT consulted.  - refuses to have his PICC line removed. Refuses to have foley removed.  -Plastic surgery team consulted, patient is not a surgical candidate for a flap procedure  -Patient is s/p ileostomy for decubitus ulcers  -PRN Roxicodone for pain control (15 mg Q4H prn and 20 mg Q4H prn)  -Bowel regimen ordered with Miralax and Senokot.  Last BM 9/28  -Decubitus ulcer precautions, frequent repositioning  -Blood cultures positive for MRSA; repeat blood cultures NGTD. Last positive culture on 9/24.  -TTE and TEE completed with no valve vegetations  - continue 20 mg lasix in AM and 10 mg lasix in evening for leg edema    Acute abdominal pain/nausea/vomiting  - suspect secondary to overeating. Will advise to take it easy.  - no rebound tenderness  - zofran prn for nausea  - will check CBC/diff for any elevated WBC. Concern also for possible UTI. Will check urinalysis    Anemia, acute blood loss  - Acute blood loss anemia secondary to decubitus ulcers on sacrum vs GI bleed  - hemoglobin  8.0 and stable  - protonix 40 mg twice daily  - Transfuse as needed below 7  -  will continue lovenox as Hgb is stable (rechecking today)  - monitor output from ostomy    Drug-seeking behavior   -Pain management team consulted  -Will manage pain with oral opiates    Status post ileostomy  -Ileostomy site healthy  -Ostomy consult     Severe protein- calorie malnutrition  -Albumin 1.5; double portion food ordered. Patient has ravenous appetite. Will order TSH to rule out hyperthyroidism. TSH 0.58. fT4  -Pre albumin ordered  -Likely contributing to poor wound healing  -MNT protocol and nutrition consult    Hypomagnesemia, improved    Hypocalcemia, resolved    Hepatitis C   -Hep C screen positive  -HIV screen negative  -Patient reports that he has not been treated for hepatitis-C in the past  -Will establish with infectious disease clinic on discharge    DVT/PE Prophylaxis: lovenox restarted yesterday    Disposition Planning: [facility] [place] plans to take patient back Monday morning.    Ellis Parents, MD 12/07/2016 11:37  [facility]  Internal Medicine, PGY-3  Pager [phone]        I saw and examined the patient.  I reviewed the resident's note.  I agree with the findings and plan of care as documented in the resident's note.  Any exceptions/additions are edited/noted.    Noel Christmas, MD

## 2016-12-08 LAB — URINE CULTURE: URINE CULTURE: 20000 — AB

## 2016-12-08 LAB — ADULT ROUTINE BLOOD CULTURE, SET OF 2 BOTTLES (BACTERIA AND YEAST)
BLOOD CULTURE, ROUTINE: NO GROWTH
BLOOD CULTURE, ROUTINE: NO GROWTH

## 2016-12-08 MED ORDER — SENNOSIDES 8.6 MG-DOCUSATE SODIUM 50 MG TABLET
1.00 | ORAL_TABLET | Freq: Two times a day (BID) | ORAL | Status: AC
Start: 2016-12-08 — End: ?

## 2016-12-08 MED ORDER — BACITRACIN ZINC 500 UNIT/GRAM TOPICAL PACKET: 1 | Freq: Two times a day (BID) | CUTANEOUS | 0 refills | 0 days | Status: AC

## 2016-12-08 MED ORDER — LANOLIN-OXYQUIN-PET, HYDROPHIL TOPICAL OINTMENT: Freq: Four times a day (QID) | CUTANEOUS | 0 refills | 0 days | Status: AC

## 2016-12-08 MED ORDER — SODIUM HYPOCHLORITE 0.0125 % TOPICAL SOLUTION: CUTANEOUS | 0 refills | 0 days | Status: AC

## 2016-12-08 MED ORDER — SODIUM HYPOCHLORITE 0.125 % SOLUTION: 0 refills | 0 days | Status: AC

## 2016-12-08 MED ADMIN — ondansetron HCl (PF) 4 mg/2 mL injection solution: INTRAVENOUS | @ 06:00:00

## 2016-12-08 MED ADMIN — sodium chloride 0.9 % (flush) injection syringe: @ 06:00:00

## 2016-12-08 MED ADMIN — lactated Ringers intravenous solution: ORAL | @ 10:00:00 | NDC 00338011704

## 2016-12-08 NOTE — Nurses Notes (Signed)
Pt refused to take his BP and HR and wants to continue with his sleep. Will continue to monitor.

## 2016-12-08 NOTE — Care Plan (Signed)
Problem: Mobility, Physical Impaired (Adult)  Goal: Enhanced Mobility Skills  Patient will demonstrate the desired outcomes by discharge/transition of care.   Outcome: Ongoing (see interventions/notes)   12/08/16 0155   Mobility, Physical Impaired (Adult)   Enhanced Mobility Skills making progress toward outcome     Goal: Enhanced Functional Ability  Patient will demonstrate the desired outcomes by discharge/transition of care.   Outcome: Ongoing (see interventions/notes)   12/08/16 0155   Mobility, Physical Impaired (Adult)   Enhanced Functional Ability making progress toward outcome       Problem: Health Knowledge, Opportunity to Enhance (Adult,Obstetrics,Pediatric)  Goal: Knowledgeable about Health Subject/Topic  Patient will demonstrate the desired outcomes by discharge/transition of care.   Outcome: Ongoing (see interventions/notes)   12/08/16 0155   Health Knowledge, Opportunity to Enhance (Adult,Obstetrics,Pediatric)   Knowledgeable about Health Subject/Topic making progress toward outcome       Problem: Self-Care Deficit (Adult,Obstetrics,Pediatric)  Goal: Improved Ability to Perform BADL and IADL  Patient will demonstrate the desired outcomes by discharge/transition of care.   Outcome: Ongoing (see interventions/notes)   12/08/16 0155   Self-Care Deficit (Adult,Obstetrics,Pediatric)   Improved Ability to Perform BADL and IADL making progress toward outcome       Problem: Wound (Includes Pressure Injury) (Adult)  Prevent and manage potential problems including:  1. bleeding  2. delayed wound healing  3. infection  4. pain  5. situational response  6. skin injury, new onset   Goal: Signs and Symptoms of Listed Potential Problems Will be Absent, Minimized or Managed (Wound)  Signs and symptoms of listed potential problems will be absent, minimized or managed by discharge/transition of care (reference Wound (Includes Pressure Injury) (Adult) CPG).   Outcome: Ongoing (see interventions/notes)   12/08/16 0155    Wound (Includes Pressure Injury)   Problems Assessed (Wound) all   Problems Present (Wound) delayed wound healing;pain       Problem: Depression (Adult,Obstetrics,Pediatric)  Goal: Establish/Maintain Self-Care Routine  Patient will demonstrate the desired outcomes by discharge/transition of care.   Outcome: Ongoing (see interventions/notes)   12/08/16 0155   Depression (Adult,Obstetrics,Pediatric)   Establish/Maintain Self-Care Routine making progress toward outcome     Goal: Improved/Stable Mood  Patient will demonstrate the desired outcomes by discharge/transition of care.   Outcome: Ongoing (see interventions/notes)   12/08/16 0155   Depression (Adult,Obstetrics,Pediatric)   Improved/Stable Mood making progress toward outcome       Problem: Skin Injury Risk (Adult,Obstetrics,Pediatric)  Goal: Skin Health and Integrity  Patient will demonstrate the desired outcomes by discharge/transition of care.   Outcome: Ongoing (see interventions/notes)   12/08/16 0155   Skin Injury Risk (Adult,Obstetrics,Pediatric)   Skin Health and Integrity making progress toward outcome       Problem: Social/Occupational/Functional Impairment (Disruptive Behavior) (Adult)  Goal: Improved Social/Occupational/Functional Skills (Disruptive Behavior)   12/08/16 0155   Improved Social/Occupational/Functional Skills (Disruptive Behavior)   Improved Social/Occupational/Functional Skills Action Step (STG) Outcome making progress toward outcome       Problem: Patient Care Overview (Adult,OB)  Goal: Plan of Care Review(Adult,OB)  The patient and/or their representative will communicate an understanding of their plan of care   Outcome: Ongoing (see interventions/notes)    Maintained on high fall precaution, placed call light and personal items within easy reach. Active listening utilized and encouraged to verbalized feelings. Assisted to turned to sides every 2 hours. Performed wound care and changed the dressings on the wound sites. Maintained on  contact-MRSA precaution. Assisted in cleaning and changing the  ostomy bag appliance while promoting independence. Continuously assessed for pain and nausea and vomiting and PRN meds given as necessary. Hourly rounds for safety was observed. Will continue to monitor.        12/08/16 0155   Coping/Psychosocial   Plan Of Care Reviewed With patient     Goal: Individualization/Patient Specific Goal(Adult/OB)  Outcome: Ongoing (see interventions/notes)      Problem: Fall Risk (Adult)  Goal: Absence of Falls  Patient will demonstrate the desired outcomes by discharge/transition of care.   Outcome: Ongoing (see interventions/notes)   12/08/16 0155   Fall Risk (Adult)   Absence of Falls making progress toward outcome

## 2016-12-08 NOTE — Nurses Notes (Signed)
Assessment per flowsheet. Vital signs stable. Patient having no pain at present time. Will continue to monitor.

## 2016-12-08 NOTE — Nurses Notes (Signed)
[facility] discharged to [facility]. Printed and reviewed after visit summary. Reviewed new and old meds and follow up procedures. Patient verbally understood. Patient being discharged with foley and PICC line. Ambulance here for transport.

## 2016-12-08 NOTE — Progress Notes (Signed)
[facility]  Medicine Progress Note  Full Code    [name]  Date of service: 12/08/2016    Subjective:  No acute events overnight. Eating large breakfast this morning.  The longer having the abdominal pain that he was having yesterday.  Endorses some twitching of his arms, however were visible.  No fevers overnight.    Vital Signs:  Temp (24hrs) Max:37.1 C (98.8 F)      Systolic (24hrs), Avg:110 , Min:103 , Max:116     Diastolic (24hrs), Avg:62, Min:58, Max:64    Temp  Avg: 36.9 C (98.4 F)  Min: 36.8 C (98.2 F)  Max: 37.1 C (98.8 F)  Pulse  Avg: 93.3  Min: 90  Max: 99  Resp  Avg: 18.4  Min: 18  Max: 20  SpO2  Avg: 98.5 %  Min: 98 %  Max: 99 %  Pain Score (Numeric, Faces): 7  Fi02    I/O:  I/O last 24 hours:      Intake/Output Summary (Last 24 hours) at 12/08/16 1052  Last data filed at 12/08/16 1008   Gross per 24 hour   Intake          1973.33 ml   Output             2000 ml   Net           -26.67 ml     I/O current shift:  10/01 0800 - 10/01 1559  In: 393.33 [P.O.:360; I.V.:33.33]  Out: 250 [Urine:250]      Current Facility-Administered Medications:  acetaminophen (TYLENOL) tablet 650 mg Oral Q6HRS   ascorbic acid (VITAMIN C) tablet 500 mg Oral 2x/day   bacitracin zinc 500 units/gram topical ointment packet 1 Packet Topical 2x/day   baclofen (LIORESAL) tablet 20 mg Oral 3x/day   ceFEPime (MAXIPIME) 2 g in iso-osmotic 100 mL premix IVPB 2 g Intravenous Q8H   DAPTOmycin (CUBICIN) 550 mg in NS 100 mL IVPB 6 mg/kg Intravenous Q24H   enoxaparin PF (LOVENOX) 40 mg/0.4 mL SubQ injection 40 mg Subcutaneous Q24H   escitalopram (LEXAPRO) tablet 20 mg Oral Daily   furosemide (LASIX) tablet 20 mg Oral QAM   furosemide (LASIX) tablet 10 mg Oral QPM   influenza virus vaccine (PF) IM injection (FLUARIX for ages 23 months and older) 0.5 mL Intramuscular Once   lanolin-oxyquin-pet, hydrophil (BAG BALM) topical ointment  Apply Topically 4x/day   metroNIDAZOLE (FLAGYL) tablet 500 mg Oral Q8HRS   NS flush syringe  2 mL Intracatheter Q8HRS   And      NS flush syringe 2-6 mL Intracatheter Q1 MIN PRN   ondansetron (ZOFRAN) 2 mg/mL injection 4 mg Intravenous Q8H PRN   oxyCODONE (ROXICODONE) immediate release tablet 15 mg Oral Q4H PRN   Or      oxyCODONE (ROXICODONE) immediate release tablet 20 mg Oral Q4H PRN   pantoprazole (PROTONIX) delayed release tablet 40 mg Oral Daily   perflutren lipid microspheres (DEFINITY) 1.1 mg/mL injection 0.3 mL Intravenous Give in Cardiology   pregabalin (LYRICA) capsule 150 mg 150 mg Oral 2x/day   sennosides-docusate sodium (SENOKOT-S) 8.6-50mg  per tablet 1 Tab Oral 2x/day   sodium hypochlorite (DAKINS) 0.125% (quarter strength) irrigation  Irrigation Q12H   sodium hypochlorite 0.0125% (WOUNDCLENZ OTC) irrigation  Irrigation Q12H   zinc sulfate (ZINCATE) 220 mg (50 mg elemental zinc) tablet 220 mg Oral Daily       No Known Allergies    Physical Exam:  General: Appears chronically ill.  No acute distress.  Head: Normocephalic and atraumatic.  No obvious abnormality.  Eyes: Conjunctiva clear.  Pupils equal, round.  Extraocular eye movement intact.    Respiratory: Breathing non-labored. Clear to auscultation bilateral. No wheezes, rhonchi, or rales.  Cardiovascular: Tachycardic. Regular rhythm Normal S1/S2.   Gastrointestinal: Ostomy present. Mildly tender to palpation.  Bowel sounds normal. No hepatomegaly.  Extremities: No cyanosis or clubbing.  Skin: Bilateral stage IV sacral decubitus ulcers to bilateral hips. Stage IV coccyx ulcer.    Neurologic: A/O x3.  Paraplegia lower extremities.      Labs:  CBC   Recent Labs      12/06/16   0515   HGB  8.0*   HCT  24.7*        BMP   No results for input(s): SODIUM, POTASSIUM, CHLORIDE, CO2, BUN, CREATININE, GLUCOSENF, GLUCOSEFAST, ANIONGAP, BUNCRRATIO, GFR in the last 72 hours.      Radiology:  No new imaging    Microbiology:   Blood cultures no growth to date since 9/24. MRSA from blood cultures on 9/22.    PT/OT: Yes    Consults:  Plastic surgery, pain  management    Hardware (lines, foley's, tubes):  Peripheral IV lines, colostomy, PICC, foley catheter    Assessment/ Plan:   Active Hospital Problems    Diagnosis   . Primary Problem: Sepsis (CMS HCC)   . Hepatitis C antibody positive in blood   . Drug-seeking behavior   . Decubitus ulcer     36 year old male with a history of extensive decubitus ulcers, presented with MRSA sepsis and osteomyelitis.    MRSA Sepsis  Osteomyelitis and sacral decubitus stage 4 ulcers, bilateral  -Daptomycin/Cefepime/flagyl continued. (6 week course from last negative blood culture puts his last day of abx on 01/12/17). OPAT consulted.  - refuses to have his PICC line removed. Refuses to have foley removed.  -Plastic surgery team consulted, patient is not a surgical candidate for a flap procedure  -Patient is s/p ileostomy for decubitus ulcers  -PRN Roxicodone for pain control (15 mg Q4H prn and 20 mg Q4H prn)  -Bowel regimen with Miralax and Senokot.  -Decubitus ulcer precautions, frequent repositioning  -Blood cultures positive for MRSA; repeat blood cultures NGTD. Last positive culture on 9/24.  -TTE and TEE completed with no valve vegetations  -continue 20 mg lasix in AM and 10 mg lasix in evening for leg edema    Anemia, acute blood loss  - Acute blood loss anemia secondary to decubitus ulcers on sacrum vs GI bleed  - hemoglobin 8.0 and stable  - protonix 40 mg daily  - Transfuse as needed below 7  - will continue lovenox as Hgb is stable    Drug-seeking behavior   -Pain management team consulted  -Will manage pain with oral opiates    Status post ileostomy  -Ileostomy site healthy  -Ostomy consult     Severe protein- calorie malnutrition  -Albumin 1.5; double portion food ordered. Patient has ravenous appetite. TSH 0.58. fT4  -Pre albumin ordered  -Likely contributing to poor wound healing  -MNT protocol and nutrition consult    Acute abdominal pain/nausea/vomiting, resolved  - suspect secondary to overeating. Will advise to take  it easy.  - no rebound tenderness. Urinalysis was normal.  Pain has resolved.    Hypomagnesemia, improved    Hypocalcemia, resolved    Hepatitis C   -Hep C screen positive  -HIV screen negative  -Patient reports that he has not been treated  for hepatitis-C in the past  -Will establish with infectious disease clinic on discharge    DVT/PE Prophylaxis: lovenox    Disposition Planning: To [facility] in [place] today.    Ellis Parents, MD 12/08/2016 10:52  [facility]  Internal Medicine, PGY-3  Pager [phone]          I saw and examined the patient.  I reviewed the resident's note.  I agree with the findings and plan of care as documented in the resident's note.  Any exceptions/additions are edited/noted.    Precious Haws, MD

## 2016-12-10 NOTE — Care Management Notes (Signed)
Referral Information  ++++++ Placed Provider #1 ++++++  Case Manager: Krystal Colunga  Provider Type: LTACH  Provider Name: [facility]  Address:  [facility] [address]  [place], [place] [address]  Contact: Pamela Daniel    Phone: [phone] x  Fax:   Fax: [phone]

## 2016-12-12 ENCOUNTER — Encounter (INDEPENDENT_AMBULATORY_CARE_PROVIDER_SITE_OTHER): Payer: Self-pay | Admitting: Infectious Disease

## 2016-12-12 LAB — TRANSTHORACIC ECHOCARDIOGRAM - ADULT
AV mean gradient: 4
AV peak velocity post stress: 142
AVA VTI: 5
AVA Vmax: 4
Biplane Simpson EF: 63
Interventricular Septum Diastolic Thickness by 2D: 1.1 cm
LA Volume Index: 26
LVIDD - 2D: 4.2 cm
LVIDS 2D: 3.1 cm
LVPWD: 1 cm
MV E/A: 1
Please see Linked Document for Final Report.: ABNORMAL
Please see Linked Document for Final Report.: ABNORMAL
Please see Linked Document for Final Report.: NORMAL
Please see Linked Document for Final Report.: NORMAL
Please see Linked Document for Final Report.: NORMAL
TR VELOCITY: 257

## 2016-12-12 NOTE — Progress Notes (Signed)
[facility] [phone] Faxing labs    Blenda Mounts, LPN  OPAT Nurse  [phone]

## 2016-12-12 NOTE — Progress Notes (Signed)
[facility] in [place] [phone] to check for labs. Left a message.    Blenda Mounts, LPN  OPAT Nurse  [phone]

## 2016-12-15 LAB — TRANSESOPHAGEAL ECHOCARDIOGRAM - ADULT
Interventricular Septum Diastolic Thickness by 2D: 0 cm
LVIDD - 2D: 0 cm
LVIDS 2D: 0 cm
LVPWD: 0 cm
Please see Linked Document for Final Report.: NORMAL

## 2016-12-18 ENCOUNTER — Encounter (INDEPENDENT_AMBULATORY_CARE_PROVIDER_SITE_OTHER): Payer: Self-pay | Admitting: Infectious Disease

## 2016-12-18 NOTE — Progress Notes (Signed)
[facility] [phone] about labs. Receptionist stated patient signed out AMA last Friday.    Blenda Mounts, LPN  OPAT Nurse  [phone]

## 2016-12-22 ENCOUNTER — Encounter (INDEPENDENT_AMBULATORY_CARE_PROVIDER_SITE_OTHER): Payer: Self-pay | Admitting: INTERNAL MEDICINE
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
## 2022-04-10 DEATH — deceased
